# Patient Record
Sex: Female | Born: 1935
Health system: Southern US, Community
[De-identification: ages and names within clinical notes are randomized; demographics above are authoritative.]

## PROBLEM LIST (undated history)

## (undated) DIAGNOSIS — C50919 Malignant neoplasm of unspecified site of unspecified female breast: Secondary | ICD-10-CM

## (undated) DIAGNOSIS — I10 Essential (primary) hypertension: Secondary | ICD-10-CM

## (undated) HISTORY — PX: CHOLECYSTECTOMY: SHX55

## (undated) HISTORY — DX: Essential (primary) hypertension: I10

## (undated) HISTORY — DX: Malignant neoplasm of unspecified site of unspecified female breast: C50.919

## (undated) HISTORY — PX: BREAST SURGERY: SHX581

## (undated) HISTORY — PX: CARPAL TUNNEL RELEASE: SHX101

---

## 2010-10-16 HISTORY — PX: BREAST LUMPECTOMY: SHX2

## 2010-12-26 ENCOUNTER — Other Ambulatory Visit: Payer: Self-pay | Admitting: Family Medicine

## 2010-12-26 DIAGNOSIS — C50912 Malignant neoplasm of unspecified site of left female breast: Secondary | ICD-10-CM

## 2010-12-30 ENCOUNTER — Ambulatory Visit
Admission: RE | Admit: 2010-12-30 | Discharge: 2010-12-30 | Disposition: A | Discharge status: Home / Self Care | Payer: Medicare Other | Source: Ambulatory Visit | Attending: Family Medicine | Admitting: Family Medicine

## 2010-12-30 DIAGNOSIS — C50912 Malignant neoplasm of unspecified site of left female breast: Secondary | ICD-10-CM

## 2010-12-30 MED ORDER — GADOBENATE DIMEGLUMINE 529 MG/ML IV SOLN
20.0000 mL | Freq: Once | INTRAVENOUS | Status: AC | PRN
  Administered 2010-12-30: 20 mL via INTRAVENOUS

## 2011-01-03 ENCOUNTER — Other Ambulatory Visit: Payer: Self-pay | Admitting: Family Medicine

## 2011-01-03 DIAGNOSIS — R928 Other abnormal and inconclusive findings on diagnostic imaging of breast: Secondary | ICD-10-CM

## 2011-01-06 ENCOUNTER — Other Ambulatory Visit: Payer: Self-pay | Admitting: Diagnostic Radiology

## 2011-01-06 ENCOUNTER — Ambulatory Visit
Admission: RE | Admit: 2011-01-06 | Discharge: 2011-01-06 | Disposition: A | Discharge status: Home / Self Care | Payer: Medicare Other | Source: Ambulatory Visit | Attending: Family Medicine | Admitting: Family Medicine

## 2011-01-06 DIAGNOSIS — R928 Other abnormal and inconclusive findings on diagnostic imaging of breast: Secondary | ICD-10-CM

## 2011-01-06 MED ORDER — GADOBENATE DIMEGLUMINE 529 MG/ML IV SOLN
20.0000 mL | Freq: Once | INTRAVENOUS | Status: AC | PRN
  Administered 2011-01-06: 20 mL via INTRAVENOUS

## 2011-01-10 ENCOUNTER — Other Ambulatory Visit (HOSPITAL_COMMUNITY): Payer: Self-pay | Admitting: General Surgery

## 2011-01-10 DIAGNOSIS — C50912 Malignant neoplasm of unspecified site of left female breast: Secondary | ICD-10-CM

## 2011-01-25 ENCOUNTER — Encounter (HOSPITAL_COMMUNITY)
Admission: RE | Admit: 2011-01-25 | Discharge: 2011-01-25 | Disposition: A | Discharge status: Home / Self Care | Payer: Medicare Other | Source: Ambulatory Visit | Attending: General Surgery | Admitting: General Surgery

## 2011-01-25 ENCOUNTER — Other Ambulatory Visit (HOSPITAL_COMMUNITY): Payer: Self-pay | Admitting: General Surgery

## 2011-01-25 ENCOUNTER — Ambulatory Visit (HOSPITAL_COMMUNITY)
Admission: RE | Admit: 2011-01-25 | Discharge: 2011-01-25 | Disposition: A | Discharge status: Home / Self Care | Payer: Medicare Other | Source: Ambulatory Visit | Attending: General Surgery | Admitting: General Surgery

## 2011-01-25 DIAGNOSIS — C50919 Malignant neoplasm of unspecified site of unspecified female breast: Secondary | ICD-10-CM | POA: Insufficient documentation

## 2011-01-25 DIAGNOSIS — Z01811 Encounter for preprocedural respiratory examination: Secondary | ICD-10-CM | POA: Insufficient documentation

## 2011-01-25 DIAGNOSIS — Z01812 Encounter for preprocedural laboratory examination: Secondary | ICD-10-CM | POA: Insufficient documentation

## 2011-01-25 DIAGNOSIS — Z01818 Encounter for other preprocedural examination: Secondary | ICD-10-CM | POA: Insufficient documentation

## 2011-01-25 LAB — SURGICAL PCR SCREEN
MRSA, PCR: NEGATIVE
Staphylococcus aureus: NEGATIVE

## 2011-01-25 LAB — CBC
HCT: 40 % (ref 36.0–46.0)
MCH: 29.5 pg (ref 26.0–34.0)
MCV: 84.9 fL (ref 78.0–100.0)
RDW: 12.1 % (ref 11.5–15.5)
WBC: 7.6 10*3/uL (ref 4.0–10.5)

## 2011-01-25 LAB — COMPREHENSIVE METABOLIC PANEL
Alkaline Phosphatase: 87 U/L (ref 39–117)
BUN: 15 mg/dL (ref 6–23)
Chloride: 94 mEq/L — ABNORMAL LOW (ref 96–112)
Glucose, Bld: 97 mg/dL (ref 70–99)
Potassium: 3.9 mEq/L (ref 3.5–5.1)
Total Bilirubin: 0.8 mg/dL (ref 0.3–1.2)
Total Protein: 7.3 g/dL (ref 6.0–8.3)

## 2011-01-31 ENCOUNTER — Observation Stay (HOSPITAL_COMMUNITY)
Admission: RE | Admit: 2011-01-31 | Discharge: 2011-02-03 | Disposition: A | Discharge status: Home / Self Care | Payer: Medicare Other | Source: Ambulatory Visit | Attending: General Surgery | Admitting: General Surgery

## 2011-01-31 ENCOUNTER — Ambulatory Visit (HOSPITAL_COMMUNITY)
Admission: RE | Admit: 2011-01-31 | Discharge: 2011-01-31 | Disposition: A | Discharge status: Home / Self Care | Payer: Medicare Other | Source: Ambulatory Visit | Attending: General Surgery | Admitting: General Surgery

## 2011-01-31 DIAGNOSIS — I10 Essential (primary) hypertension: Secondary | ICD-10-CM | POA: Insufficient documentation

## 2011-01-31 DIAGNOSIS — E669 Obesity, unspecified: Secondary | ICD-10-CM | POA: Insufficient documentation

## 2011-01-31 DIAGNOSIS — Y9210 Unspecified residential institution as the place of occurrence of the external cause: Secondary | ICD-10-CM | POA: Insufficient documentation

## 2011-01-31 DIAGNOSIS — C50912 Malignant neoplasm of unspecified site of left female breast: Secondary | ICD-10-CM

## 2011-01-31 DIAGNOSIS — C50919 Malignant neoplasm of unspecified site of unspecified female breast: Principal | ICD-10-CM | POA: Insufficient documentation

## 2011-01-31 DIAGNOSIS — IMO0002 Reserved for concepts with insufficient information to code with codable children: Secondary | ICD-10-CM | POA: Insufficient documentation

## 2011-01-31 DIAGNOSIS — Y836 Removal of other organ (partial) (total) as the cause of abnormal reaction of the patient, or of later complication, without mention of misadventure at the time of the procedure: Secondary | ICD-10-CM | POA: Insufficient documentation

## 2011-01-31 DIAGNOSIS — Z79899 Other long term (current) drug therapy: Secondary | ICD-10-CM | POA: Insufficient documentation

## 2011-01-31 DIAGNOSIS — D62 Acute posthemorrhagic anemia: Secondary | ICD-10-CM | POA: Insufficient documentation

## 2011-01-31 MED ORDER — TECHNETIUM TC 99M SULFUR COLLOID FILTERED
1.0000 | Freq: Once | INTRAVENOUS | Status: AC | PRN
  Administered 2011-01-31: 1 via INTRADERMAL

## 2011-02-01 ENCOUNTER — Other Ambulatory Visit: Payer: Self-pay | Admitting: General Surgery

## 2011-02-01 LAB — CBC
MCH: 29.5 pg (ref 26.0–34.0)
MCV: 83.4 fL (ref 78.0–100.0)
Platelets: 226 10*3/uL (ref 150–400)
Platelets: 221 10*3/uL (ref 150–400)
RDW: 12.1 % (ref 11.5–15.5)
RDW: 12.3 % (ref 11.5–15.5)
WBC: 13.2 10*3/uL — ABNORMAL HIGH (ref 4.0–10.5)

## 2011-02-01 LAB — ABO/RH: ABO/RH(D): O NEG

## 2011-02-02 LAB — PREPARE RBC (CROSSMATCH)

## 2011-02-02 LAB — CBC
HCT: 22.4 % — ABNORMAL LOW (ref 36.0–46.0)
Hemoglobin: 7.9 g/dL — ABNORMAL LOW (ref 12.0–15.0)
RDW: 13 % (ref 11.5–15.5)
WBC: 10.8 10*3/uL — ABNORMAL HIGH (ref 4.0–10.5)

## 2011-02-02 LAB — BASIC METABOLIC PANEL
BUN: 22 mg/dL (ref 6–23)
GFR calc non Af Amer: 37 mL/min — ABNORMAL LOW (ref >60)
Potassium: 4.3 mEq/L (ref 3.5–5.1)
Sodium: 126 mEq/L — ABNORMAL LOW (ref 135–145)

## 2011-02-03 LAB — BASIC METABOLIC PANEL
BUN: 11 mg/dL (ref 6–23)
Chloride: 100 mEq/L (ref 96–112)
GFR calc Af Amer: >60 mL/min (ref >60)
Potassium: 4.2 mEq/L (ref 3.5–5.1)
Sodium: 132 mEq/L — ABNORMAL LOW (ref 135–145)

## 2011-02-03 LAB — CBC
MCV: 85.6 fL (ref 78.0–100.0)
Platelets: 178 10*3/uL (ref 150–400)
RBC: 2.98 MIL/uL — ABNORMAL LOW (ref 3.87–5.11)
WBC: 7.9 10*3/uL (ref 4.0–10.5)

## 2011-02-03 LAB — TYPE AND SCREEN

## 2011-02-03 NOTE — Op Note (Signed)
Rebecca Gonzalez, Rebecca Gonzalez               ACCOUNT NO.:  [id number]  MEDICAL RECORD NO.:  [id number]           PATIENT TYPE:  O  LOCATION:  5124                         FACILITY:  [facility]  PHYSICIAN:  Wilmon Arms. Corliss Skains, M.D. DATE OF BIRTH:  1935-07-12  DATE OF PROCEDURE:  02/01/2011 DATE OF DISCHARGE:                              OPERATIVE REPORT   PREOPERATIVE DIAGNOSIS:  Post mastectomy hematoma.  POSTOPERATIVE DIAGNOSIS:  Post mastectomy hematoma.  PROCEDURE PERFORMED:  Irrigation and drainage of a post mastectomy hematoma.  SURGEON:  Wilmon Arms. Ruben Mahler, MD  ANESTHESIA:  General.  INDICATIONS:  This is a 76 year old female who underwent a left mastectomy and left axillary sentinel lymph node biopsy on January 31, 2011.  She had 2 drains placed.  Postoperatively, the patient initially was doing well but acutely developed a lot of swelling under her upper skin flap and as well as a bleeding through her drain and around her drain sites.  I was the surgeon on-call and I was called to see the patient.  We immediately made a decision to return to the operating room.  Her preoperative hemoglobin was 13.9.  Her hemoglobin prior to returning to the operating room was 10.6.  DESCRIPTION OF PROCEDURE:  The patient was brought to the operating room and placed in supine position on the operating room table.  After an adequate level of general anesthesia was obtained, her left chest was prepped with ChloraPrep and draped in sterile fashion.  We opened her old incision which had been closed with 4-0 Monocryl as well as 3-0 Vicryl.  We encountered a large amount of clot.  We evacuated about 500 mL of clot.  There was some liquid blood in the axillary region.  There were no large pumping vessels that were visualized.  We then systematically began examining all of the operative site.  We began medially under the superior flap.  We irrigated and slowly inspected for hemostasis.  There were several tiny  areas that were minimally oozing that we cauterized.  We continued working laterally under the upper flap.  Once we were satisfied with the hemostasis in this area, we worked medial to lateral and under the lower flap.  Most of the oozing that we noted was from the posterior surface of the flap.  The muscle of the pectoralis actually seemed to be completely dry with no sign of bleeding.  We then spent the remainder of our operative time in the axilla.  No large bleeding vessels were identified.  However, there were several areas that were oozing which were thoroughly cauterized.  Once we were satisfied with our hemostasis, we thoroughly irrigated the entire surgical site with warm saline.  Tisseel was applied in the axilla and under the upper flap.  Two pieces of Surgicel SNoW were placed into the pockets in the axilla where the sentinel lymph node had been biopsied.  We then closed with multiple interrupted 3-0 Vicryl sutures.  4-0 Monocryl was used to close the skin in a subcuticular fashion.  Dermabond was applied as well as Steri-Strips.  The patient was then placed in a new breast  binder.  Please note that prior to closing, we replaced her drains.  The medial drain goes underneath the inferior flap medially around the upper flap.  The more lateral drain is in her axilla.  These drains were sutured in with 2-0 Ethilon.  Dry dressing was applied.  The patient was extubated and brought to recovery room in stable condition.  All sponge, instrument, and needle counts were correct.     Wilmon Arms. Corliss Skains, M.D.     MKT/MEDQ  D:  02/01/2011  T:  02/01/2011  Job:  [id number]  Electronically Signed by Manus Rudd M.D. on 02/03/2011 10:30:53 AM

## 2011-02-03 NOTE — Op Note (Signed)
NAMEREBECKA, OELKERS NO.:  [id number]  MEDICAL RECORD NO.:  [id number]           PATIENT TYPE:  O  LOCATION:  5124                         FACILITY:  [facility]  PHYSICIAN:  Juanetta Gosling, MDDATE OF BIRTH:  1935-06-02  DATE OF PROCEDURE:  01/31/2011 DATE OF DISCHARGE:                              OPERATIVE REPORT   PREOPERATIVE DIAGNOSIS:  Multicentric left breast cancer.  POSTOPERATIVE DIAGNOSIS:  Multicentric left breast cancer.  PROCEDURE: 1. Left simple mastectomy. 2. Injection of blue dye for sentinel node identification. 3. Left axillary sentinel node biopsy.  SURGEON:  Troy Sine. Dwain Sarna, MD  ASSISTANT:  None.  ANESTHESIA:  General.  SPECIMEN: 1. Left breast marked short stitch superior, long stitch lateral. 2. Left axillary sentinel node biopsy with counts of 852.  DISPOSITION OF SPECIMENS:  Pathology.  DRAINS:  Two 19-French Blake drains to mastectomy space in axilla.  ESTIMATED BLOOD LOSS:  100 mL.  COMPLICATIONS:  None.  DISPOSITION:  To recovery room in stable condition.  INDICATIONS:  Ms. Gainey is a 76 year old female who underwent a routine screening mammogram with a possible left breast mass, underwent an ultrasound.  There was a small mass that underwent core biopsy showing invasive ductal carcinoma that was grade 1.  This was ER positive and weakly PR positive.  HER-2 was negative.  She underwent MRI showed another mass in the outer quadrant of the breast that was biopsy showing DCIS.  These areas were about 7 cm apart in two separate quadrants of the breast.  She and I discussed her options and we elected to perform a left simple mastectomy with a sentinel node biopsy at the same time.  PROCEDURE:  After informed consent was obtained, the patient was taken to the operating room after being administered technetium in all four cardinal positions around the breast.  She then had sequential compression devices placed.   She was administered 400 mg of IV ciprofloxacin due to her penicillin allergy.  She was then placed under general anesthesia with an LMA without complication.  Surgical time-out was then performed.  I cleansed the around the nipple and I injected a milliliter of saline and methylene blue mixture in all four positions around her areola. This was massaged for 3 minutes.  Her left breast and axilla were then prepped and draped in standard sterile surgical fashion.  I then made a large elliptical incision encompassing her nipple and areola and developed flaps in the breast, to the clavicle, sternum, and below the inframammary crease and onto the latissimus laterally.  There were a couple of places that bled and were from the pectoral vessels that I placed 3-0 Vicryl sutures as well as several clips.  Once I had reached all the margins, I then removed the breast from the pectoralis muscle including the pectoralis fascia without difficulty.  I then placed a couple of Kelly's at the lateralmost portion of this and tied these areas where the vessels near the latissimus were.  This was then passed off table as specimen.  I inspected this to see if there were any sentinel nodes in there which they  were not.  I then placed a probe in the axilla.  I identified one sentinel node.  This was excised with the counts above, the background counts were less than 10.  There were no other blue dye identified at all either.  These were all passed off the table as a specimen.  Irrigation was performed and hemostasis was observed.  I placed two 19-French Blake drains.  I then closed the dermis with multiple 3-0 interrupted Vicryl sutures.  The skin was closed with 4-0 Monocryl.  I then placed Dermabond over the wound. Steri-Strips were placed over this.  The drains were both functional upon completion.  Dressing was placed and the binder was then placed over this.  She tolerated this well, was extubated in the  operating room, and transferred to the recovery room in stable condition.     Juanetta Gosling, MD     MCW/MEDQ  D:  01/31/2011  T:  02/01/2011  Job:  [id number]  cc:   Ivan Anchors, M.D.  Electronically Signed by Emelia Loron MD on 02/03/2011 08:58:10 AM

## 2011-02-07 ENCOUNTER — Ambulatory Visit
Admission: RE | Admit: 2011-02-07 | Discharge: 2011-02-07 | Disposition: A | Discharge status: Home / Self Care | Payer: Medicare Other | Source: Ambulatory Visit | Attending: General Surgery | Admitting: General Surgery

## 2011-02-07 ENCOUNTER — Other Ambulatory Visit: Payer: Self-pay | Admitting: General Surgery

## 2011-02-07 DIAGNOSIS — M7989 Other specified soft tissue disorders: Secondary | ICD-10-CM

## 2011-02-20 ENCOUNTER — Encounter (INDEPENDENT_AMBULATORY_CARE_PROVIDER_SITE_OTHER): Payer: Self-pay | Admitting: General Surgery

## 2011-04-21 ENCOUNTER — Ambulatory Visit (INDEPENDENT_AMBULATORY_CARE_PROVIDER_SITE_OTHER): Payer: Medicare Other | Admitting: General Surgery

## 2011-04-21 ENCOUNTER — Encounter (INDEPENDENT_AMBULATORY_CARE_PROVIDER_SITE_OTHER): Payer: Self-pay | Admitting: General Surgery

## 2011-04-21 DIAGNOSIS — N63 Unspecified lump in unspecified breast: Secondary | ICD-10-CM | POA: Insufficient documentation

## 2011-04-21 DIAGNOSIS — C50919 Malignant neoplasm of unspecified site of unspecified female breast: Secondary | ICD-10-CM

## 2011-04-21 NOTE — Progress Notes (Signed)
Subjective:     Patient ID: Rebecca Gonzalez, female   DOB: Feb 27, 1935, 76 y.o.   MRN: [id number]    There were no vitals taken for this visit.    HPIThis is a 76 year old female underwent left simple mastectomy stage I breast cancer. She had a seroma that was drained afterwards. She has been seen by Dr. Margo Common who is following her coronary. And she is comfortable not to be on any adjuvant therapy right now. She came in today just for a final check after I drained or seroma the last time I saw her. She's really do well with any complaints and was to be released to full activity at this point.She has been undergoing massage therapy at [facility] which is helped a lot of her arm function as well as her healing.   Review of Systems     Objective:   Physical Examwell healed left mastectomy incision, no infection, no seroma     Assessment:     Stage I left breast cancer s/p mastectomy    Plan:        I released to full activity today. She is due to get her mammogram in October. She is due to see Dr. Cleone Slim in August. I asked her to come back and see me 6 months after she saw Dr. Cleone Slim had to switch visits with him.

## 2011-05-29 ENCOUNTER — Encounter (INDEPENDENT_AMBULATORY_CARE_PROVIDER_SITE_OTHER): Payer: Self-pay | Admitting: General Surgery

## 2011-05-29 ENCOUNTER — Ambulatory Visit (INDEPENDENT_AMBULATORY_CARE_PROVIDER_SITE_OTHER): Payer: Medicare Other | Admitting: General Surgery

## 2011-05-29 VITALS — BP 170/88 | HR 84 | Temp 97.1°F | Wt 229.6 lb

## 2011-05-29 DIAGNOSIS — R233 Spontaneous ecchymoses: Secondary | ICD-10-CM

## 2011-05-29 NOTE — Progress Notes (Signed)
Subjective:     Patient ID: Rebecca Gonzalez, female   DOB: [birth year], 76 y.o.   MRN: [id number]  HPI This patient follows up after a left mastectomy performed in April of this year. She has been doing well since her procedure until approximately 2 weeks ago she was doing some stretching exercises and felt a "pop" in her left chest and breast area under her previous mastectomy scar. She had a little bit of "soreness" but a few days later developed some redness and bruising in the region. She denies any fevers or chills or any pain. She is not on any blood there is currently. She is currently being treated at the lymphedema clinic for some lymphedema in the chest region but does not have much swelling in the arm.  Review of Systems     Objective:   Physical Exam She is in no acute distress and nontoxic-appearing sitting comfortably in the bed. Her left chest demonstrates a well-healed mastectomy scar with some lymphedema in the skin of the chest. She has some ecchymosis in the medial aspect of her incision with evidence of bruising in the later stages of healing extending lateral. There is a greenish hue to the skin in the area of the bruising. There is no evidence of blanching erythema, crepitus, tenderness. There is no evidence of suspicious masses.    Assessment:     Chest wall bruising    Plan:     This is certainly an interesting finding.  She appears to have some bruising on the medial aspect of her mastectomy scar without any evidence of recurrent disease. This appears to be in the healing stages and I suspect will resolve spontaneously. I am not sure why she would have had spontaneous bleeding under her incision other than due to her stretching and exercise. Again, I think that this will resolve spontaneously and I do not see any evidence of suspicious skin lesions or recurrent disease. I think it would be okay if she continue with her lymphatic treatments. I recommended that she follow up  in 2 weeks if her symptoms do not completely resolve.

## 2011-05-29 NOTE — Patient Instructions (Signed)
Follow up in 2 weeks if redness and bruising is not completely resolved

## 2011-05-30 ENCOUNTER — Encounter (HOSPITAL_COMMUNITY)
Admission: RE | Disposition: A | Discharge status: Home / Self Care | Payer: Self-pay | Source: Ambulatory Visit | Attending: Ophthalmology

## 2011-05-30 ENCOUNTER — Ambulatory Visit (HOSPITAL_COMMUNITY)
Admission: RE | Admit: 2011-05-30 | Discharge: 2011-05-30 | Disposition: A | Discharge status: Home / Self Care | Payer: Medicare Other | Source: Ambulatory Visit | Attending: Ophthalmology | Admitting: Ophthalmology

## 2011-05-30 ENCOUNTER — Encounter (HOSPITAL_COMMUNITY): Payer: Self-pay | Admitting: *Deleted

## 2011-05-30 DIAGNOSIS — Z961 Presence of intraocular lens: Secondary | ICD-10-CM | POA: Insufficient documentation

## 2011-05-30 DIAGNOSIS — I10 Essential (primary) hypertension: Secondary | ICD-10-CM | POA: Insufficient documentation

## 2011-05-30 DIAGNOSIS — H26499 Other secondary cataract, unspecified eye: Secondary | ICD-10-CM | POA: Insufficient documentation

## 2011-05-30 SURGERY — TREATMENT, USING YAG LASER
Anesthesia: LOCAL | Laterality: Left

## 2011-05-30 MED ORDER — CYCLOPENTOLATE-PHENYLEPHRINE 0.2-1 % OP SOLN
OPHTHALMIC | Status: AC
  Administered 2011-05-30: 1 [drp] via OPHTHALMIC
  Filled 2011-05-30: qty 2

## 2011-05-30 MED ORDER — CYCLOPENTOLATE-PHENYLEPHRINE 0.2-1 % OP SOLN
1.0000 [drp] | OPHTHALMIC | Status: AC
  Administered 2011-05-30 (×3): 1 [drp] via OPHTHALMIC

## 2011-05-30 NOTE — Progress Notes (Signed)
Rebecca Gonzalez  05/30/2011     Instructions    Activity: No Restrictions.   Diet: Resume Diet you were on at home.   Pain Medication: Tylenol if Needed.   CONTACT YOUR DOCTOR IF YOU HAVE PAIN, REDNESS IN YOUR EYE, OR DECREASED VISION.   Follow-up:today @ 1300 with Najee Cowens T. Nile Riggs.   Dr. Nile Riggs: [phone]  Dr. Lita Mains: [phone]  Dr. Alto Denver: [phone]   If you find that you cannot contact your physician, but feel that your signs and   Symptoms warrant a physician's attention, call the Emergency Room at   [phone] ext.[phone]. Marland Kitchen

## 2011-05-30 NOTE — Brief Op Note (Signed)
               Rebecca Gonzalez 05/30/2011  Jahki Witham T. Nile Riggs  Yag Laser Self Test Completedyes. Procedure: Posterior Capsulotomy, left eye.  Eye Protection Worn by Staff yes. Laser In Use Sign on Door yes.  Laser: Nd:YAG Spot Size: Fixed Burst Mode: 1 Power Setting:  3.20mJ/burst  Number of shots: 29 Total energy delivered: 98.9 mJ  Patency of the peripheral iridotomy was confirmed visually.  The patient tolerated the procedure without difficulty. No complications were encountered.    The patient was discharged home with the instructions to continue all her current glaucoma medications, if any.   Patient instructed to go to office at 0100 for intraocular pressure check.  Patient verbalizes understanding of discharge instructions

## 2011-06-12 ENCOUNTER — Ambulatory Visit (INDEPENDENT_AMBULATORY_CARE_PROVIDER_SITE_OTHER): Payer: Medicare Other | Admitting: General Surgery

## 2011-06-12 ENCOUNTER — Encounter (INDEPENDENT_AMBULATORY_CARE_PROVIDER_SITE_OTHER): Payer: Self-pay | Admitting: General Surgery

## 2011-06-12 DIAGNOSIS — Z853 Personal history of malignant neoplasm of breast: Secondary | ICD-10-CM

## 2011-06-12 NOTE — Progress Notes (Signed)
Subjective:     Patient ID: Rebecca Gonzalez, female   DOB: 09-Nov-1934, 76 y.o.   MRN: [id number]  HPI This is a 76 year old female who is status post left simple mastectomy as well as a sentinel node biopsy for invasive ductal carcinoma. This was complicated by hematoma that needed to be evacuated. She has been doing well. She is also followed by Dr. Cleone Slim. She a couple weeks ago had an episode of stretching where she felt a pop in the medial aspect of her mastectomy incision followed by some ecchymoses. She was seen in our practice at that point. This has gotten better but she still is mildly tenderness still is an area of ecchymosis present the medial aspect of her incision. She comes in today just had this area checked.  Review of Systems     Objective:   Physical Exam Well healed left mastectomy incision with about a 6x6 area of firmness consistent with ecchymosis, this is better per her report than a couple weeks ago, there is no evidence of recurrence    Assessment:     Hematoma s/p mastectomy distant past    Plan:     I don't think this is a recurrence of her cancer. Clinically it appears very much like a hematoma that occurred after injury during exercise. I told her that I think we can just let this area resolved over some time. Her regularly scheduled followup was with me in November when I will see her again. At that point this area still there we may consider ultrasound as well but I think he should just resolve over some time.

## 2011-10-24 DIAGNOSIS — S20219A Contusion of unspecified front wall of thorax, initial encounter: Secondary | ICD-10-CM | POA: Diagnosis not present

## 2011-12-05 DIAGNOSIS — K219 Gastro-esophageal reflux disease without esophagitis: Secondary | ICD-10-CM | POA: Diagnosis not present

## 2011-12-05 DIAGNOSIS — I10 Essential (primary) hypertension: Secondary | ICD-10-CM | POA: Diagnosis not present

## 2011-12-05 DIAGNOSIS — Z79899 Other long term (current) drug therapy: Secondary | ICD-10-CM | POA: Diagnosis not present

## 2011-12-05 DIAGNOSIS — Z09 Encounter for follow-up examination after completed treatment for conditions other than malignant neoplasm: Secondary | ICD-10-CM | POA: Diagnosis not present

## 2011-12-05 DIAGNOSIS — Z9010 Acquired absence of unspecified breast and nipple: Secondary | ICD-10-CM | POA: Diagnosis not present

## 2011-12-05 DIAGNOSIS — Z853 Personal history of malignant neoplasm of breast: Secondary | ICD-10-CM | POA: Diagnosis not present

## 2011-12-05 DIAGNOSIS — Z1231 Encounter for screening mammogram for malignant neoplasm of breast: Secondary | ICD-10-CM | POA: Diagnosis not present

## 2011-12-12 DIAGNOSIS — I10 Essential (primary) hypertension: Secondary | ICD-10-CM | POA: Diagnosis not present

## 2011-12-12 DIAGNOSIS — Z853 Personal history of malignant neoplasm of breast: Secondary | ICD-10-CM | POA: Diagnosis not present

## 2011-12-12 DIAGNOSIS — Z79899 Other long term (current) drug therapy: Secondary | ICD-10-CM | POA: Diagnosis not present

## 2011-12-12 DIAGNOSIS — Z9010 Acquired absence of unspecified breast and nipple: Secondary | ICD-10-CM | POA: Diagnosis not present

## 2011-12-12 DIAGNOSIS — K219 Gastro-esophageal reflux disease without esophagitis: Secondary | ICD-10-CM | POA: Diagnosis not present

## 2011-12-12 DIAGNOSIS — Z09 Encounter for follow-up examination after completed treatment for conditions other than malignant neoplasm: Secondary | ICD-10-CM | POA: Diagnosis not present

## 2011-12-12 DIAGNOSIS — Z1231 Encounter for screening mammogram for malignant neoplasm of breast: Secondary | ICD-10-CM | POA: Diagnosis not present

## 2011-12-22 ENCOUNTER — Encounter (INDEPENDENT_AMBULATORY_CARE_PROVIDER_SITE_OTHER): Payer: Self-pay | Admitting: Hematology and Oncology

## 2011-12-22 ENCOUNTER — Encounter (INDEPENDENT_AMBULATORY_CARE_PROVIDER_SITE_OTHER): Payer: Self-pay

## 2011-12-22 ENCOUNTER — Encounter (INDEPENDENT_AMBULATORY_CARE_PROVIDER_SITE_OTHER): Payer: Self-pay | Admitting: General Surgery

## 2011-12-22 ENCOUNTER — Ambulatory Visit (INDEPENDENT_AMBULATORY_CARE_PROVIDER_SITE_OTHER): Payer: Medicare Other | Admitting: General Surgery

## 2011-12-22 VITALS — BP 152/94 | HR 80 | Temp 96.9°F | Resp 16 | Ht 62.0 in | Wt 236.4 lb

## 2011-12-22 DIAGNOSIS — Z853 Personal history of malignant neoplasm of breast: Secondary | ICD-10-CM | POA: Diagnosis not present

## 2011-12-22 DIAGNOSIS — C50919 Malignant neoplasm of unspecified site of unspecified female breast: Secondary | ICD-10-CM | POA: Insufficient documentation

## 2011-12-22 NOTE — Progress Notes (Signed)
Subjective:     Patient ID: Rebecca Gonzalez, female   DOB: Dec 13, 1934, 77 y.o.   MRN: [id number]  HPI This is a 77 year old female underwent a left simple mastectomy for a stage I left breast cancer. Since then she's had a hematoma after she was exercising which is now resolved. She had another episode where she has some bruising after some strenuous exercise but is otherwise doing very well at this point. She comes in today for an annual visit without any significant complaints. She states that she recently had her right-sided mammogram at [facility] where this was recommended to have a year followup. She has no specific complaints were filled to her right breast or her mastectomy site today.  Review of Systems     Objective:   Physical Exam  Vitals reviewed. Constitutional: She appears well-developed and well-nourished.  Pulmonary/Chest: Right breast exhibits no inverted nipple, no mass, no nipple discharge, no skin change and no tenderness. Left breast exhibits no mass.    Lymphadenopathy:    She has no cervical adenopathy.    She has no axillary adenopathy.       Right: No supraclavicular adenopathy present.       Left: No supraclavicular adenopathy present.       Assessment:     History stage I left breast cancer    Plan:     She has no clinical evidence of any recurrent disease. She is going to continue her own self exams as well as her annual mammography. I will review her latest mammogram. She is due to see medical oncology in 6 months. I offered that I can see her annually in between her medical oncology visits. I told her that I think he issues with her hematoma likely from her muscle at the medial aspect of her incision and that she can continue doing whatever sort of exercise that she likes.

## 2011-12-22 NOTE — Patient Instructions (Signed)

## 2012-02-28 DIAGNOSIS — E782 Mixed hyperlipidemia: Secondary | ICD-10-CM | POA: Diagnosis not present

## 2012-03-06 DIAGNOSIS — J309 Allergic rhinitis, unspecified: Secondary | ICD-10-CM | POA: Diagnosis not present

## 2012-03-06 DIAGNOSIS — E669 Obesity, unspecified: Secondary | ICD-10-CM | POA: Diagnosis not present

## 2012-03-06 DIAGNOSIS — M1990 Unspecified osteoarthritis, unspecified site: Secondary | ICD-10-CM | POA: Diagnosis not present

## 2012-03-06 DIAGNOSIS — E782 Mixed hyperlipidemia: Secondary | ICD-10-CM | POA: Diagnosis not present

## 2012-03-06 DIAGNOSIS — C50919 Malignant neoplasm of unspecified site of unspecified female breast: Secondary | ICD-10-CM | POA: Diagnosis not present

## 2012-03-06 DIAGNOSIS — I10 Essential (primary) hypertension: Secondary | ICD-10-CM | POA: Diagnosis not present

## 2012-06-10 ENCOUNTER — Encounter: Payer: Medicare Other | Admitting: Hematology and Oncology

## 2012-06-10 DIAGNOSIS — C50919 Malignant neoplasm of unspecified site of unspecified female breast: Secondary | ICD-10-CM

## 2012-06-10 DIAGNOSIS — K219 Gastro-esophageal reflux disease without esophagitis: Secondary | ICD-10-CM | POA: Diagnosis not present

## 2012-06-10 DIAGNOSIS — I10 Essential (primary) hypertension: Secondary | ICD-10-CM | POA: Diagnosis not present

## 2012-07-30 DIAGNOSIS — H2510 Age-related nuclear cataract, unspecified eye: Secondary | ICD-10-CM | POA: Diagnosis not present

## 2012-07-30 DIAGNOSIS — H524 Presbyopia: Secondary | ICD-10-CM | POA: Diagnosis not present

## 2012-07-30 DIAGNOSIS — H26019 Infantile and juvenile cortical, lamellar, or zonular cataract, unspecified eye: Secondary | ICD-10-CM | POA: Diagnosis not present

## 2012-07-30 DIAGNOSIS — Z961 Presence of intraocular lens: Secondary | ICD-10-CM | POA: Diagnosis not present

## 2012-08-23 DIAGNOSIS — R5383 Other fatigue: Secondary | ICD-10-CM | POA: Diagnosis not present

## 2012-08-23 DIAGNOSIS — R05 Cough: Secondary | ICD-10-CM | POA: Diagnosis not present

## 2012-08-23 DIAGNOSIS — R072 Precordial pain: Secondary | ICD-10-CM | POA: Diagnosis not present

## 2012-08-23 DIAGNOSIS — E039 Hypothyroidism, unspecified: Secondary | ICD-10-CM | POA: Diagnosis not present

## 2012-08-23 DIAGNOSIS — R5381 Other malaise: Secondary | ICD-10-CM | POA: Diagnosis not present

## 2012-08-23 DIAGNOSIS — R0602 Shortness of breath: Secondary | ICD-10-CM | POA: Diagnosis not present

## 2012-08-23 DIAGNOSIS — I10 Essential (primary) hypertension: Secondary | ICD-10-CM | POA: Diagnosis not present

## 2012-08-27 DIAGNOSIS — R0602 Shortness of breath: Secondary | ICD-10-CM | POA: Diagnosis not present

## 2012-08-27 DIAGNOSIS — I10 Essential (primary) hypertension: Secondary | ICD-10-CM | POA: Diagnosis not present

## 2012-08-27 DIAGNOSIS — R079 Chest pain, unspecified: Secondary | ICD-10-CM | POA: Diagnosis not present

## 2012-09-10 DIAGNOSIS — C50919 Malignant neoplasm of unspecified site of unspecified female breast: Secondary | ICD-10-CM | POA: Diagnosis not present

## 2012-09-10 DIAGNOSIS — J309 Allergic rhinitis, unspecified: Secondary | ICD-10-CM | POA: Diagnosis not present

## 2012-09-10 DIAGNOSIS — E669 Obesity, unspecified: Secondary | ICD-10-CM | POA: Diagnosis not present

## 2012-09-10 DIAGNOSIS — E782 Mixed hyperlipidemia: Secondary | ICD-10-CM | POA: Diagnosis not present

## 2012-09-10 DIAGNOSIS — M1990 Unspecified osteoarthritis, unspecified site: Secondary | ICD-10-CM | POA: Diagnosis not present

## 2012-09-10 DIAGNOSIS — I10 Essential (primary) hypertension: Secondary | ICD-10-CM | POA: Diagnosis not present

## 2012-10-11 DIAGNOSIS — L820 Inflamed seborrheic keratosis: Secondary | ICD-10-CM | POA: Diagnosis not present

## 2012-10-11 DIAGNOSIS — L821 Other seborrheic keratosis: Secondary | ICD-10-CM | POA: Diagnosis not present

## 2012-12-09 DIAGNOSIS — Z1231 Encounter for screening mammogram for malignant neoplasm of breast: Secondary | ICD-10-CM | POA: Diagnosis not present

## 2013-01-17 ENCOUNTER — Telehealth (INDEPENDENT_AMBULATORY_CARE_PROVIDER_SITE_OTHER): Payer: Self-pay | Admitting: General Surgery

## 2013-01-17 NOTE — Telephone Encounter (Signed)
Misty Stanley called from [employer] and wanted to know if we can let the patient have a new prothesis bra, the old one she has is to little, I gave a orders over the phone for a new bra

## 2013-02-26 DIAGNOSIS — E782 Mixed hyperlipidemia: Secondary | ICD-10-CM | POA: Diagnosis not present

## 2013-02-26 DIAGNOSIS — C50919 Malignant neoplasm of unspecified site of unspecified female breast: Secondary | ICD-10-CM | POA: Diagnosis not present

## 2013-02-26 DIAGNOSIS — I10 Essential (primary) hypertension: Secondary | ICD-10-CM | POA: Diagnosis not present

## 2013-02-26 DIAGNOSIS — E669 Obesity, unspecified: Secondary | ICD-10-CM | POA: Diagnosis not present

## 2013-02-26 DIAGNOSIS — J309 Allergic rhinitis, unspecified: Secondary | ICD-10-CM | POA: Diagnosis not present

## 2013-02-26 DIAGNOSIS — M1990 Unspecified osteoarthritis, unspecified site: Secondary | ICD-10-CM | POA: Diagnosis not present

## 2013-03-05 DIAGNOSIS — E782 Mixed hyperlipidemia: Secondary | ICD-10-CM | POA: Diagnosis not present

## 2013-03-05 DIAGNOSIS — C50919 Malignant neoplasm of unspecified site of unspecified female breast: Secondary | ICD-10-CM | POA: Diagnosis not present

## 2013-03-05 DIAGNOSIS — Z0000 Encounter for general adult medical examination without abnormal findings: Secondary | ICD-10-CM | POA: Diagnosis not present

## 2013-03-05 DIAGNOSIS — E669 Obesity, unspecified: Secondary | ICD-10-CM | POA: Diagnosis not present

## 2013-03-05 DIAGNOSIS — I10 Essential (primary) hypertension: Secondary | ICD-10-CM | POA: Diagnosis not present

## 2013-03-05 DIAGNOSIS — M1990 Unspecified osteoarthritis, unspecified site: Secondary | ICD-10-CM | POA: Diagnosis not present

## 2013-03-05 DIAGNOSIS — J309 Allergic rhinitis, unspecified: Secondary | ICD-10-CM | POA: Diagnosis not present

## 2013-08-13 DIAGNOSIS — E782 Mixed hyperlipidemia: Secondary | ICD-10-CM | POA: Diagnosis not present

## 2013-08-13 DIAGNOSIS — I10 Essential (primary) hypertension: Secondary | ICD-10-CM | POA: Diagnosis not present

## 2013-08-13 DIAGNOSIS — E669 Obesity, unspecified: Secondary | ICD-10-CM | POA: Diagnosis not present

## 2013-08-20 DIAGNOSIS — Z1331 Encounter for screening for depression: Secondary | ICD-10-CM | POA: Diagnosis not present

## 2013-08-20 DIAGNOSIS — M1990 Unspecified osteoarthritis, unspecified site: Secondary | ICD-10-CM | POA: Diagnosis not present

## 2013-08-20 DIAGNOSIS — C50919 Malignant neoplasm of unspecified site of unspecified female breast: Secondary | ICD-10-CM | POA: Diagnosis not present

## 2013-08-20 DIAGNOSIS — I10 Essential (primary) hypertension: Secondary | ICD-10-CM | POA: Diagnosis not present

## 2013-08-20 DIAGNOSIS — E782 Mixed hyperlipidemia: Secondary | ICD-10-CM | POA: Diagnosis not present

## 2013-08-20 DIAGNOSIS — E669 Obesity, unspecified: Secondary | ICD-10-CM | POA: Diagnosis not present

## 2013-08-20 DIAGNOSIS — J309 Allergic rhinitis, unspecified: Secondary | ICD-10-CM | POA: Diagnosis not present

## 2013-10-14 DIAGNOSIS — H43399 Other vitreous opacities, unspecified eye: Secondary | ICD-10-CM | POA: Diagnosis not present

## 2013-10-14 DIAGNOSIS — H524 Presbyopia: Secondary | ICD-10-CM | POA: Diagnosis not present

## 2013-10-14 DIAGNOSIS — H2510 Age-related nuclear cataract, unspecified eye: Secondary | ICD-10-CM | POA: Diagnosis not present

## 2013-10-14 DIAGNOSIS — H31009 Unspecified chorioretinal scars, unspecified eye: Secondary | ICD-10-CM | POA: Diagnosis not present

## 2013-10-14 DIAGNOSIS — Z961 Presence of intraocular lens: Secondary | ICD-10-CM | POA: Diagnosis not present

## 2013-12-10 DIAGNOSIS — Z1231 Encounter for screening mammogram for malignant neoplasm of breast: Secondary | ICD-10-CM | POA: Diagnosis not present

## 2014-03-16 DIAGNOSIS — R5381 Other malaise: Secondary | ICD-10-CM | POA: Diagnosis not present

## 2014-03-16 DIAGNOSIS — E782 Mixed hyperlipidemia: Secondary | ICD-10-CM | POA: Diagnosis not present

## 2014-03-16 DIAGNOSIS — K2100 Gastro-esophageal reflux disease with esophagitis, without bleeding: Secondary | ICD-10-CM | POA: Diagnosis not present

## 2014-03-16 DIAGNOSIS — R7309 Other abnormal glucose: Secondary | ICD-10-CM | POA: Diagnosis not present

## 2014-03-16 DIAGNOSIS — I10 Essential (primary) hypertension: Secondary | ICD-10-CM | POA: Diagnosis not present

## 2014-03-16 DIAGNOSIS — R5383 Other fatigue: Secondary | ICD-10-CM | POA: Diagnosis not present

## 2014-03-20 DIAGNOSIS — C50919 Malignant neoplasm of unspecified site of unspecified female breast: Secondary | ICD-10-CM | POA: Diagnosis not present

## 2014-03-20 DIAGNOSIS — M1990 Unspecified osteoarthritis, unspecified site: Secondary | ICD-10-CM | POA: Diagnosis not present

## 2014-03-20 DIAGNOSIS — E669 Obesity, unspecified: Secondary | ICD-10-CM | POA: Diagnosis not present

## 2014-03-20 DIAGNOSIS — I10 Essential (primary) hypertension: Secondary | ICD-10-CM | POA: Diagnosis not present

## 2014-03-20 DIAGNOSIS — J309 Allergic rhinitis, unspecified: Secondary | ICD-10-CM | POA: Diagnosis not present

## 2014-03-20 DIAGNOSIS — Z0000 Encounter for general adult medical examination without abnormal findings: Secondary | ICD-10-CM | POA: Diagnosis not present

## 2014-03-20 DIAGNOSIS — K2100 Gastro-esophageal reflux disease with esophagitis, without bleeding: Secondary | ICD-10-CM | POA: Diagnosis not present

## 2014-03-20 DIAGNOSIS — E782 Mixed hyperlipidemia: Secondary | ICD-10-CM | POA: Diagnosis not present

## 2014-08-26 DIAGNOSIS — I10 Essential (primary) hypertension: Secondary | ICD-10-CM | POA: Diagnosis not present

## 2014-08-26 DIAGNOSIS — E6609 Other obesity due to excess calories: Secondary | ICD-10-CM | POA: Diagnosis not present

## 2014-08-26 DIAGNOSIS — E782 Mixed hyperlipidemia: Secondary | ICD-10-CM | POA: Diagnosis not present

## 2014-09-17 DIAGNOSIS — K210 Gastro-esophageal reflux disease with esophagitis: Secondary | ICD-10-CM | POA: Diagnosis not present

## 2014-09-17 DIAGNOSIS — R739 Hyperglycemia, unspecified: Secondary | ICD-10-CM | POA: Diagnosis not present

## 2014-09-17 DIAGNOSIS — I10 Essential (primary) hypertension: Secondary | ICD-10-CM | POA: Diagnosis not present

## 2014-09-24 DIAGNOSIS — I10 Essential (primary) hypertension: Secondary | ICD-10-CM | POA: Diagnosis not present

## 2014-09-24 DIAGNOSIS — R739 Hyperglycemia, unspecified: Secondary | ICD-10-CM | POA: Diagnosis not present

## 2014-09-24 DIAGNOSIS — Z1389 Encounter for screening for other disorder: Secondary | ICD-10-CM | POA: Diagnosis not present

## 2014-09-24 DIAGNOSIS — E6609 Other obesity due to excess calories: Secondary | ICD-10-CM | POA: Diagnosis not present

## 2014-09-24 DIAGNOSIS — J301 Allergic rhinitis due to pollen: Secondary | ICD-10-CM | POA: Diagnosis not present

## 2014-09-24 DIAGNOSIS — E782 Mixed hyperlipidemia: Secondary | ICD-10-CM | POA: Diagnosis not present

## 2014-09-24 DIAGNOSIS — K210 Gastro-esophageal reflux disease with esophagitis: Secondary | ICD-10-CM | POA: Diagnosis not present

## 2014-09-24 DIAGNOSIS — Z9189 Other specified personal risk factors, not elsewhere classified: Secondary | ICD-10-CM | POA: Diagnosis not present

## 2014-10-28 DIAGNOSIS — Z961 Presence of intraocular lens: Secondary | ICD-10-CM | POA: Diagnosis not present

## 2014-10-28 DIAGNOSIS — H43392 Other vitreous opacities, left eye: Secondary | ICD-10-CM | POA: Diagnosis not present

## 2014-12-02 DIAGNOSIS — J209 Acute bronchitis, unspecified: Secondary | ICD-10-CM | POA: Diagnosis not present

## 2014-12-02 DIAGNOSIS — J0101 Acute recurrent maxillary sinusitis: Secondary | ICD-10-CM | POA: Diagnosis not present

## 2014-12-11 DIAGNOSIS — R05 Cough: Secondary | ICD-10-CM | POA: Diagnosis not present

## 2014-12-11 DIAGNOSIS — J209 Acute bronchitis, unspecified: Secondary | ICD-10-CM | POA: Diagnosis not present

## 2014-12-17 DIAGNOSIS — Z9012 Acquired absence of left breast and nipple: Secondary | ICD-10-CM | POA: Diagnosis not present

## 2014-12-17 DIAGNOSIS — Z1231 Encounter for screening mammogram for malignant neoplasm of breast: Secondary | ICD-10-CM | POA: Diagnosis not present

## 2015-04-07 DIAGNOSIS — I10 Essential (primary) hypertension: Secondary | ICD-10-CM | POA: Diagnosis not present

## 2015-04-07 DIAGNOSIS — R739 Hyperglycemia, unspecified: Secondary | ICD-10-CM | POA: Diagnosis not present

## 2015-04-07 DIAGNOSIS — E782 Mixed hyperlipidemia: Secondary | ICD-10-CM | POA: Diagnosis not present

## 2015-04-07 DIAGNOSIS — K210 Gastro-esophageal reflux disease with esophagitis: Secondary | ICD-10-CM | POA: Diagnosis not present

## 2015-04-15 DIAGNOSIS — J301 Allergic rhinitis due to pollen: Secondary | ICD-10-CM | POA: Diagnosis not present

## 2015-04-15 DIAGNOSIS — K219 Gastro-esophageal reflux disease without esophagitis: Secondary | ICD-10-CM | POA: Diagnosis not present

## 2015-04-15 DIAGNOSIS — Z0001 Encounter for general adult medical examination with abnormal findings: Secondary | ICD-10-CM | POA: Diagnosis not present

## 2015-04-15 DIAGNOSIS — I10 Essential (primary) hypertension: Secondary | ICD-10-CM | POA: Diagnosis not present

## 2015-04-15 DIAGNOSIS — E6609 Other obesity due to excess calories: Secondary | ICD-10-CM | POA: Diagnosis not present

## 2015-06-29 DIAGNOSIS — M79644 Pain in right finger(s): Secondary | ICD-10-CM | POA: Diagnosis not present

## 2015-06-29 DIAGNOSIS — S61230A Puncture wound without foreign body of right index finger without damage to nail, initial encounter: Secondary | ICD-10-CM | POA: Diagnosis not present

## 2015-06-29 DIAGNOSIS — W5501XA Bitten by cat, initial encounter: Secondary | ICD-10-CM | POA: Diagnosis not present

## 2015-08-23 DIAGNOSIS — L570 Actinic keratosis: Secondary | ICD-10-CM | POA: Diagnosis not present

## 2015-08-23 DIAGNOSIS — C44329 Squamous cell carcinoma of skin of other parts of face: Secondary | ICD-10-CM | POA: Diagnosis not present

## 2015-08-23 DIAGNOSIS — L989 Disorder of the skin and subcutaneous tissue, unspecified: Secondary | ICD-10-CM | POA: Diagnosis not present

## 2015-09-29 DIAGNOSIS — E782 Mixed hyperlipidemia: Secondary | ICD-10-CM | POA: Diagnosis not present

## 2015-09-29 DIAGNOSIS — R739 Hyperglycemia, unspecified: Secondary | ICD-10-CM | POA: Diagnosis not present

## 2015-09-29 DIAGNOSIS — I10 Essential (primary) hypertension: Secondary | ICD-10-CM | POA: Diagnosis not present

## 2015-09-29 DIAGNOSIS — K210 Gastro-esophageal reflux disease with esophagitis: Secondary | ICD-10-CM | POA: Diagnosis not present

## 2015-10-06 DIAGNOSIS — E782 Mixed hyperlipidemia: Secondary | ICD-10-CM | POA: Diagnosis not present

## 2015-10-06 DIAGNOSIS — J301 Allergic rhinitis due to pollen: Secondary | ICD-10-CM | POA: Diagnosis not present

## 2015-10-06 DIAGNOSIS — K219 Gastro-esophageal reflux disease without esophagitis: Secondary | ICD-10-CM | POA: Diagnosis not present

## 2015-10-06 DIAGNOSIS — I10 Essential (primary) hypertension: Secondary | ICD-10-CM | POA: Diagnosis not present

## 2015-10-06 DIAGNOSIS — Z9189 Other specified personal risk factors, not elsewhere classified: Secondary | ICD-10-CM | POA: Diagnosis not present

## 2015-10-06 DIAGNOSIS — Z1389 Encounter for screening for other disorder: Secondary | ICD-10-CM | POA: Diagnosis not present

## 2015-10-06 DIAGNOSIS — R7301 Impaired fasting glucose: Secondary | ICD-10-CM | POA: Diagnosis not present

## 2015-10-06 DIAGNOSIS — E6609 Other obesity due to excess calories: Secondary | ICD-10-CM | POA: Diagnosis not present

## 2015-12-20 DIAGNOSIS — R928 Other abnormal and inconclusive findings on diagnostic imaging of breast: Secondary | ICD-10-CM | POA: Diagnosis not present

## 2015-12-20 DIAGNOSIS — Z1231 Encounter for screening mammogram for malignant neoplasm of breast: Secondary | ICD-10-CM | POA: Diagnosis not present

## 2015-12-28 DIAGNOSIS — H25011 Cortical age-related cataract, right eye: Secondary | ICD-10-CM | POA: Diagnosis not present

## 2015-12-28 DIAGNOSIS — Z961 Presence of intraocular lens: Secondary | ICD-10-CM | POA: Diagnosis not present

## 2015-12-28 DIAGNOSIS — H2511 Age-related nuclear cataract, right eye: Secondary | ICD-10-CM | POA: Diagnosis not present

## 2015-12-28 DIAGNOSIS — H25013 Cortical age-related cataract, bilateral: Secondary | ICD-10-CM | POA: Diagnosis not present

## 2015-12-28 DIAGNOSIS — H2513 Age-related nuclear cataract, bilateral: Secondary | ICD-10-CM | POA: Diagnosis not present

## 2015-12-29 ENCOUNTER — Other Ambulatory Visit: Payer: Self-pay | Admitting: Family Medicine

## 2015-12-29 DIAGNOSIS — Z9012 Acquired absence of left breast and nipple: Secondary | ICD-10-CM | POA: Diagnosis not present

## 2015-12-29 DIAGNOSIS — N6489 Other specified disorders of breast: Secondary | ICD-10-CM | POA: Diagnosis not present

## 2015-12-29 DIAGNOSIS — R928 Other abnormal and inconclusive findings on diagnostic imaging of breast: Secondary | ICD-10-CM | POA: Diagnosis not present

## 2015-12-29 DIAGNOSIS — Z853 Personal history of malignant neoplasm of breast: Secondary | ICD-10-CM | POA: Diagnosis not present

## 2016-01-10 ENCOUNTER — Ambulatory Visit
Admission: RE | Admit: 2016-01-10 | Discharge: 2016-01-10 | Disposition: A | Discharge status: Home / Self Care | Payer: Medicare Other | Source: Ambulatory Visit | Attending: Family Medicine | Admitting: Family Medicine

## 2016-01-10 ENCOUNTER — Other Ambulatory Visit: Payer: Self-pay | Admitting: Family Medicine

## 2016-01-10 DIAGNOSIS — R928 Other abnormal and inconclusive findings on diagnostic imaging of breast: Secondary | ICD-10-CM

## 2016-01-10 DIAGNOSIS — N6011 Diffuse cystic mastopathy of right breast: Secondary | ICD-10-CM | POA: Diagnosis not present

## 2016-02-03 NOTE — Patient Instructions (Signed)
Your procedure is scheduled on: 02/08/2016  Report to [facility] at  40   AM.  Call this number if you have problems the morning of surgery: [phone]   Do not eat food or drink liquids :After Midnight.      Take these medicines the morning of surgery with A SIP OF WATER: losartan, protonix.   Do not wear jewelry, make-up or nail polish.  Do not wear lotions, powders, or perfumes. You may wear deodorant.  Do not shave 48 hours prior to surgery.  Do not bring valuables to the hospital.  Contacts, dentures or bridgework may not be worn into surgery.  Leave suitcase in the car. After surgery it may be brought to your room.  For patients admitted to the hospital, checkout time is 11:00 AM the day of discharge.   Patients discharged the day of surgery will not be allowed to drive home.  :     Please read over the following fact sheets that you were given: Coughing and Deep Breathing, Surgical Site Infection Prevention, Anesthesia Post-op Instructions and Care and Recovery After Surgery    Cataract A cataract is a clouding of the lens of the eye. When a lens becomes cloudy, vision is reduced based on the degree and nature of the clouding. Many cataracts reduce vision to some degree. Some cataracts make people more near-sighted as they develop. Other cataracts increase glare. Cataracts that are ignored and become worse can sometimes look white. The white color can be seen through the pupil. CAUSES   Aging. However, cataracts may occur at any age, even in newborns.   Certain drugs.   Trauma to the eye.   Certain diseases such as diabetes.   Specific eye diseases such as chronic inflammation inside the eye or a sudden attack of a rare form of glaucoma.   Inherited or acquired medical problems.  SYMPTOMS   Gradual, progressive drop in vision in the affected eye.   Severe, rapid visual loss. This most often happens when trauma is the cause.  DIAGNOSIS  To detect a cataract, an eye  doctor examines the lens. Cataracts are best diagnosed with an exam of the eyes with the pupils enlarged (dilated) by drops.  TREATMENT  For an early cataract, vision may improve by using different eyeglasses or stronger lighting. If that does not help your vision, surgery is the only effective treatment. A cataract needs to be surgically removed when vision loss interferes with your everyday activities, such as driving, reading, or watching TV. A cataract may also have to be removed if it prevents examination or treatment of another eye problem. Surgery removes the cloudy lens and usually replaces it with a substitute lens (intraocular lens, IOL).  At a time when both you and your doctor agree, the cataract will be surgically removed. If you have cataracts in both eyes, only one is usually removed at a time. This allows the operated eye to heal and be out of danger from any possible problems after surgery (such as infection or poor wound healing). In rare cases, a cataract may be doing damage to your eye. In these cases, your caregiver may advise surgical removal right away. The vast majority of people who have cataract surgery have better vision afterward. HOME CARE INSTRUCTIONS  If you are not planning surgery, you may be asked to do the following:  Use different eyeglasses.   Use stronger or brighter lighting.   Ask your eye doctor about reducing your medicine  dose or changing medicines if it is thought that a medicine caused your cataract. Changing medicines does not make the cataract go away on its own.   Become familiar with your surroundings. Poor vision can lead to injury. Avoid bumping into things on the affected side. You are at a higher risk for tripping or falling.   Exercise extreme care when driving or operating machinery.   Wear sunglasses if you are sensitive to bright light or experiencing problems with glare.  SEEK IMMEDIATE MEDICAL CARE IF:   You have a worsening or sudden  vision loss.   You notice redness, swelling, or increasing pain in the eye.   You have a fever.  Document Released: 10/02/2005 Document Revised: 09/21/2011 Document Reviewed: 05/26/2011 [facility] Patient Information 2012 [place].PATIENT INSTRUCTIONS POST-ANESTHESIA  IMMEDIATELY FOLLOWING SURGERY:  Do not drive or operate machinery for the first twenty four hours after surgery.  Do not make any important decisions for twenty four hours after surgery or while taking narcotic pain medications or sedatives.  If you develop intractable nausea and vomiting or a severe headache please notify your doctor immediately.  FOLLOW-UP:  Please make an appointment with your surgeon as instructed. You do not need to follow up with anesthesia unless specifically instructed to do so.  WOUND CARE INSTRUCTIONS (if applicable):  Keep a dry clean dressing on the anesthesia/puncture wound site if there is drainage.  Once the wound has quit draining you may leave it open to air.  Generally you should leave the bandage intact for twenty four hours unless there is drainage.  If the epidural site drains for more than 36-48 hours please call the anesthesia department.  QUESTIONS?:  Please feel free to call your physician or the hospital operator if you have any questions, and they will be happy to assist you.

## 2016-02-04 ENCOUNTER — Encounter (HOSPITAL_COMMUNITY)
Admission: RE | Admit: 2016-02-04 | Discharge: 2016-02-04 | Disposition: A | Discharge status: Home / Self Care | Payer: TRICARE For Life (TFL) | Source: Ambulatory Visit | Attending: Ophthalmology | Admitting: Ophthalmology

## 2016-02-04 ENCOUNTER — Encounter (HOSPITAL_COMMUNITY): Payer: Self-pay

## 2016-02-04 DIAGNOSIS — Z01810 Encounter for preprocedural cardiovascular examination: Secondary | ICD-10-CM | POA: Insufficient documentation

## 2016-02-04 DIAGNOSIS — Z01812 Encounter for preprocedural laboratory examination: Secondary | ICD-10-CM | POA: Diagnosis not present

## 2016-02-04 LAB — CBC
HEMATOCRIT: 39 % (ref 36.0–46.0)
HEMOGLOBIN: 13.2 g/dL (ref 12.0–15.0)
MCH: 28.8 pg (ref 26.0–34.0)
MCHC: 33.8 g/dL (ref 30.0–36.0)
MCV: 85.2 fL (ref 78.0–100.0)
Platelets: 227 10*3/uL (ref 150–400)
RBC: 4.58 MIL/uL (ref 3.87–5.11)
RDW: 12.9 % (ref 11.5–15.5)
WBC: 7.9 10*3/uL (ref 4.0–10.5)

## 2016-02-04 LAB — BASIC METABOLIC PANEL
ANION GAP: 11 (ref 5–15)
BUN: 17 mg/dL (ref 6–20)
CO2: 26 mmol/L (ref 22–32)
Calcium: 9.4 mg/dL (ref 8.9–10.3)
Chloride: 98 mmol/L — ABNORMAL LOW (ref 101–111)
Creatinine, Ser: 0.85 mg/dL (ref 0.44–1.00)
GFR calc non Af Amer: >60 mL/min (ref >60)
GLUCOSE: 186 mg/dL — AB (ref 65–99)
POTASSIUM: 3.6 mmol/L (ref 3.5–5.1)
Sodium: 135 mmol/L (ref 135–145)

## 2016-02-08 ENCOUNTER — Ambulatory Visit (HOSPITAL_COMMUNITY): Payer: Medicare Other | Admitting: Anesthesiology

## 2016-02-08 ENCOUNTER — Ambulatory Visit (HOSPITAL_COMMUNITY)
Admission: RE | Admit: 2016-02-08 | Discharge: 2016-02-08 | Disposition: A | Discharge status: Home / Self Care | Payer: Medicare Other | Source: Ambulatory Visit | Attending: Ophthalmology | Admitting: Ophthalmology

## 2016-02-08 ENCOUNTER — Encounter (HOSPITAL_COMMUNITY)
Admission: RE | Disposition: A | Discharge status: Home / Self Care | Payer: Self-pay | Source: Ambulatory Visit | Attending: Ophthalmology

## 2016-02-08 ENCOUNTER — Encounter (HOSPITAL_COMMUNITY): Payer: Self-pay | Admitting: *Deleted

## 2016-02-08 DIAGNOSIS — H268 Other specified cataract: Secondary | ICD-10-CM | POA: Insufficient documentation

## 2016-02-08 DIAGNOSIS — Z7982 Long term (current) use of aspirin: Secondary | ICD-10-CM | POA: Insufficient documentation

## 2016-02-08 DIAGNOSIS — Z79899 Other long term (current) drug therapy: Secondary | ICD-10-CM | POA: Insufficient documentation

## 2016-02-08 DIAGNOSIS — Z6841 Body Mass Index (BMI) 40.0 and over, adult: Secondary | ICD-10-CM | POA: Insufficient documentation

## 2016-02-08 DIAGNOSIS — H269 Unspecified cataract: Secondary | ICD-10-CM | POA: Diagnosis not present

## 2016-02-08 DIAGNOSIS — H2511 Age-related nuclear cataract, right eye: Secondary | ICD-10-CM | POA: Diagnosis not present

## 2016-02-08 DIAGNOSIS — H25011 Cortical age-related cataract, right eye: Secondary | ICD-10-CM | POA: Diagnosis not present

## 2016-02-08 DIAGNOSIS — K219 Gastro-esophageal reflux disease without esophagitis: Secondary | ICD-10-CM | POA: Insufficient documentation

## 2016-02-08 DIAGNOSIS — I10 Essential (primary) hypertension: Secondary | ICD-10-CM | POA: Insufficient documentation

## 2016-02-08 HISTORY — PX: CATARACT EXTRACTION W/PHACO: SHX586

## 2016-02-08 SURGERY — PHACOEMULSIFICATION, CATARACT, WITH IOL INSERTION
Anesthesia: Monitor Anesthesia Care | Site: Eye | Laterality: Right

## 2016-02-08 MED ORDER — KETOROLAC TROMETHAMINE 0.5 % OP SOLN
1.0000 [drp] | OPHTHALMIC | Status: AC
  Administered 2016-02-08 (×3): 1 [drp] via OPHTHALMIC

## 2016-02-08 MED ORDER — EPINEPHRINE HCL 1 MG/ML IJ SOLN
INTRAMUSCULAR | Status: AC
  Filled 2016-02-08: qty 1

## 2016-02-08 MED ORDER — TETRACAINE 0.5 % OP SOLN OPTIME - NO CHARGE
OPHTHALMIC | Status: DC | PRN
  Administered 2016-02-08: 2 [drp] via OPHTHALMIC

## 2016-02-08 MED ORDER — PHENYLEPHRINE HCL 2.5 % OP SOLN
1.0000 [drp] | OPHTHALMIC | Status: AC
  Administered 2016-02-08 (×3): 1 [drp] via OPHTHALMIC

## 2016-02-08 MED ORDER — PROVISC 10 MG/ML IO SOLN
INTRAOCULAR | Status: DC | PRN
  Administered 2016-02-08: 0.85 mL via INTRAOCULAR

## 2016-02-08 MED ORDER — CYCLOPENTOLATE-PHENYLEPHRINE 0.2-1 % OP SOLN
1.0000 [drp] | OPHTHALMIC | Status: AC
  Administered 2016-02-08 (×3): 1 [drp] via OPHTHALMIC

## 2016-02-08 MED ORDER — MIDAZOLAM HCL 2 MG/2ML IJ SOLN
1.0000 mg | INTRAMUSCULAR | Status: DC | PRN
  Administered 2016-02-08: 2 mg via INTRAVENOUS

## 2016-02-08 MED ORDER — MIDAZOLAM HCL 2 MG/2ML IJ SOLN
INTRAMUSCULAR | Status: AC
Start: 2016-02-08 — End: 2016-02-08
  Filled 2016-02-08: qty 2

## 2016-02-08 MED ORDER — EPINEPHRINE HCL 1 MG/ML IJ SOLN
INTRAOCULAR | Status: DC | PRN
  Administered 2016-02-08: 500 mL

## 2016-02-08 MED ORDER — TETRACAINE HCL 0.5 % OP SOLN
1.0000 [drp] | OPHTHALMIC | Status: AC
  Administered 2016-02-08 (×3): 1 [drp] via OPHTHALMIC

## 2016-02-08 MED ORDER — LACTATED RINGERS IV SOLN
INTRAVENOUS | Status: DC
  Administered 2016-02-08: 11:00:00 via INTRAVENOUS

## 2016-02-08 MED ORDER — BSS IO SOLN
INTRAOCULAR | Status: DC | PRN
  Administered 2016-02-08: 15 mL

## 2016-02-08 SURGICAL SUPPLY — 10 items

## 2016-02-08 NOTE — Op Note (Signed)
Patient brought to the operating room and prepped and draped in the usual manner.  Lid speculum inserted in right eye.  Stab incision made at the twelve o'clock position.  Provisc instilled in the anterior chamber.   A 2.4 mm. Stab incision was made temporally.  An anterior capsulotomy was done with a bent 25 gauge needle.  The nucleus was hydrodissected.  The Phaco tip was inserted in the anterior chamber and the nucleus was emulsified.  CDE was 5,56.  The cortical material was then removed with the I and A tip.  Posterior capsule was the polished.  The anterior chamber was deepened with Provisc.  A 22.5 Diopter Alcon [id number] IOL was then inserted in the capsular bag.  Provisc was then removed with the I and A tip.  The wound was then hydrated.  Patient sent to the Recovery Room in good condition with follow up in my office.  Preoperative Diagnosis:  Cortical and Nuclear Cataract OD Postoperative Diagnosis:  Same Procedure name: Kelman Phacoemulsification OD with IOL

## 2016-02-08 NOTE — Transfer of Care (Signed)
Immediate Anesthesia Transfer of Care Note  Patient: Rebecca Gonzalez  Procedure(s) Performed: Procedure(s) with comments: CATARACT EXTRACTION PHACO AND INTRAOCULAR LENS PLACEMENT (IOC) (Right) - CDE:5.56  Patient Location: Short Stay  Anesthesia Type:MAC  Level of Consciousness: awake  Airway & Oxygen Therapy: Patient Spontanous Breathing  Post-op Assessment: Report given to RN  Post vital signs: Reviewed and stable  Last Vitals:  Filed Vitals:   02/08/16 1022  BP: 146/65  Temp: 36.6 C  Resp: 15    Complications: No apparent anesthesia complications

## 2016-02-08 NOTE — H&P (Signed)
The patient was re examined and there is no change in the patients condition since the original H and P. 

## 2016-02-08 NOTE — Discharge Instructions (Signed)
°  °          [facility] Instructions °[address]- [place] [address]-[place] °    ° °1. Avoid closing eyes tightly. One often closes the eye tightly when laughing, talking, sneezing, coughing or if they feel irritated. At these times, you should be careful not to close your eyes tightly. ° °2. Instill eye drops as instructed. To instill drops in your eye, open it, look up and have someone gently pull the lower lid down and instill a couple of drops inside the lower lid. ° °3. Do not touch upper lid. ° °4. Take Advil or Tylenol for pain. ° °5. You may use either eye for near work, such as reading or sewing and you may watch television. ° °6. You may have your hair done at the beauty parlor at any time. ° °7. Wear dark glasses with or without your own glasses if you are in bright light. ° °8. Call our office at [phone] or [phone] if you have sharp pain in your eye or unusual symptoms. ° °9.  FOLLOW UP WITH DR. SHAPIRO TODAY IN HIS [place] OFFICE AT 2:45pm. ° °  °I have received a copy of the above instructions and will follow them.  ° ° ° °IF YOU ARE IN IMMEDIATE DANGER CALL 911! ° °It is important for you to keep your follow-up appointment with your physician after discharge, OR, for you /your caregiver to make a follow-up appointment with your physician / medical provider after discharge. ° °Show these instructions to the next healthcare provider you see. ° °

## 2016-02-08 NOTE — Anesthesia Postprocedure Evaluation (Signed)
Anesthesia Post Note  Patient: Rebecca Gonzalez  Procedure(s) Performed: Procedure(s) (LRB): CATARACT EXTRACTION PHACO AND INTRAOCULAR LENS PLACEMENT (IOC) (Right)  Patient location during evaluation: Short Stay Anesthesia Type: MAC Level of consciousness: awake and alert Pain management: pain level controlled Vital Signs Assessment: post-procedure vital signs reviewed and stable Respiratory status: spontaneous breathing Cardiovascular status: blood pressure returned to baseline Postop Assessment: no signs of nausea or vomiting Anesthetic complications: no    Last Vitals:  Filed Vitals:   02/08/16 1022  BP: 146/65  Temp: 36.6 C  Resp: 15    Last Pain: There were no vitals filed for this visit.               Davida Falconi

## 2016-02-08 NOTE — Anesthesia Preprocedure Evaluation (Addendum)
Anesthesia Evaluation  Patient identified by MRN, date of birth, ID band Patient awake    Reviewed: Allergy & Precautions, NPO status , Patient's Chart, lab work & pertinent test results  Airway Mallampati: III  TM Distance: >3 FB   Mouth opening: Limited Mouth Opening  Dental  (+) Teeth Intact   Pulmonary    Pulmonary exam normal        Cardiovascular hypertension, Pt. on medications Normal cardiovascular exam     Neuro/Psych    GI/Hepatic GERD  Medicated and Controlled,  Endo/Other  Morbid obesity  Renal/GU      Musculoskeletal   Abdominal (+) + obese,   Peds  Hematology   Anesthesia Other Findings   Reproductive/Obstetrics                            Anesthesia Physical Anesthesia Plan  ASA: III  Anesthesia Plan: MAC   Post-op Pain Management:    Induction: Intravenous  Airway Management Planned: Nasal Cannula  Additional Equipment:   Intra-op Plan:   Post-operative Plan:   Informed Consent: I have reviewed the patients History and Physical, chart, labs and discussed the procedure including the risks, benefits and alternatives for the proposed anesthesia with the patient or authorized representative who has indicated his/her understanding and acceptance.   Dental advisory given  Plan Discussed with:   Anesthesia Plan Comments:         Anesthesia Quick Evaluation

## 2016-02-09 ENCOUNTER — Encounter (HOSPITAL_COMMUNITY): Payer: Self-pay | Admitting: Ophthalmology

## 2016-02-29 DIAGNOSIS — R739 Hyperglycemia, unspecified: Secondary | ICD-10-CM | POA: Diagnosis not present

## 2016-02-29 DIAGNOSIS — I10 Essential (primary) hypertension: Secondary | ICD-10-CM | POA: Diagnosis not present

## 2016-02-29 DIAGNOSIS — K210 Gastro-esophageal reflux disease with esophagitis: Secondary | ICD-10-CM | POA: Diagnosis not present

## 2016-02-29 DIAGNOSIS — E782 Mixed hyperlipidemia: Secondary | ICD-10-CM | POA: Diagnosis not present

## 2016-03-07 DIAGNOSIS — R7301 Impaired fasting glucose: Secondary | ICD-10-CM | POA: Diagnosis not present

## 2016-03-07 DIAGNOSIS — K219 Gastro-esophageal reflux disease without esophagitis: Secondary | ICD-10-CM | POA: Diagnosis not present

## 2016-03-07 DIAGNOSIS — E782 Mixed hyperlipidemia: Secondary | ICD-10-CM | POA: Diagnosis not present

## 2016-03-07 DIAGNOSIS — I10 Essential (primary) hypertension: Secondary | ICD-10-CM | POA: Diagnosis not present

## 2016-03-07 DIAGNOSIS — E6609 Other obesity due to excess calories: Secondary | ICD-10-CM | POA: Diagnosis not present

## 2016-03-07 DIAGNOSIS — J301 Allergic rhinitis due to pollen: Secondary | ICD-10-CM | POA: Diagnosis not present

## 2016-06-14 ENCOUNTER — Other Ambulatory Visit: Payer: Self-pay

## 2016-06-27 DIAGNOSIS — Z961 Presence of intraocular lens: Secondary | ICD-10-CM | POA: Diagnosis not present

## 2016-07-07 ENCOUNTER — Other Ambulatory Visit: Payer: Self-pay | Admitting: Family Medicine

## 2016-07-07 DIAGNOSIS — N6489 Other specified disorders of breast: Secondary | ICD-10-CM

## 2016-07-17 ENCOUNTER — Ambulatory Visit
Admission: RE | Admit: 2016-07-17 | Discharge: 2016-07-17 | Disposition: A | Discharge status: Home / Self Care | Payer: Medicare Other | Source: Ambulatory Visit | Attending: Family Medicine | Admitting: Family Medicine

## 2016-07-17 DIAGNOSIS — N6489 Other specified disorders of breast: Secondary | ICD-10-CM

## 2016-07-17 DIAGNOSIS — R928 Other abnormal and inconclusive findings on diagnostic imaging of breast: Secondary | ICD-10-CM | POA: Diagnosis not present

## 2016-08-04 DIAGNOSIS — E782 Mixed hyperlipidemia: Secondary | ICD-10-CM | POA: Diagnosis not present

## 2016-08-04 DIAGNOSIS — I10 Essential (primary) hypertension: Secondary | ICD-10-CM | POA: Diagnosis not present

## 2016-08-04 DIAGNOSIS — R739 Hyperglycemia, unspecified: Secondary | ICD-10-CM | POA: Diagnosis not present

## 2016-08-04 DIAGNOSIS — K210 Gastro-esophageal reflux disease with esophagitis: Secondary | ICD-10-CM | POA: Diagnosis not present

## 2016-08-08 DIAGNOSIS — E6609 Other obesity due to excess calories: Secondary | ICD-10-CM | POA: Diagnosis not present

## 2016-08-08 DIAGNOSIS — R7301 Impaired fasting glucose: Secondary | ICD-10-CM | POA: Diagnosis not present

## 2016-08-08 DIAGNOSIS — Z6841 Body Mass Index (BMI) 40.0 and over, adult: Secondary | ICD-10-CM | POA: Diagnosis not present

## 2016-08-08 DIAGNOSIS — Z1212 Encounter for screening for malignant neoplasm of rectum: Secondary | ICD-10-CM | POA: Diagnosis not present

## 2016-08-08 DIAGNOSIS — E782 Mixed hyperlipidemia: Secondary | ICD-10-CM | POA: Diagnosis not present

## 2016-08-08 DIAGNOSIS — K219 Gastro-esophageal reflux disease without esophagitis: Secondary | ICD-10-CM | POA: Diagnosis not present

## 2016-08-08 DIAGNOSIS — I10 Essential (primary) hypertension: Secondary | ICD-10-CM | POA: Diagnosis not present

## 2016-08-08 DIAGNOSIS — J301 Allergic rhinitis due to pollen: Secondary | ICD-10-CM | POA: Diagnosis not present

## 2016-10-23 DIAGNOSIS — Z880 Allergy status to penicillin: Secondary | ICD-10-CM | POA: Diagnosis not present

## 2016-10-23 DIAGNOSIS — I10 Essential (primary) hypertension: Secondary | ICD-10-CM | POA: Diagnosis present

## 2016-10-23 DIAGNOSIS — E876 Hypokalemia: Secondary | ICD-10-CM | POA: Diagnosis present

## 2016-10-23 DIAGNOSIS — Z79899 Other long term (current) drug therapy: Secondary | ICD-10-CM | POA: Diagnosis not present

## 2016-10-23 DIAGNOSIS — Z886 Allergy status to analgesic agent status: Secondary | ICD-10-CM | POA: Diagnosis not present

## 2016-10-23 DIAGNOSIS — Z6841 Body Mass Index (BMI) 40.0 and over, adult: Secondary | ICD-10-CM | POA: Diagnosis not present

## 2016-10-23 DIAGNOSIS — K219 Gastro-esophageal reflux disease without esophagitis: Secondary | ICD-10-CM | POA: Diagnosis present

## 2016-10-23 DIAGNOSIS — E871 Hypo-osmolality and hyponatremia: Secondary | ICD-10-CM | POA: Diagnosis not present

## 2016-10-23 DIAGNOSIS — J111 Influenza due to unidentified influenza virus with other respiratory manifestations: Secondary | ICD-10-CM | POA: Diagnosis not present

## 2016-10-23 DIAGNOSIS — J189 Pneumonia, unspecified organism: Secondary | ICD-10-CM | POA: Diagnosis not present

## 2016-10-23 DIAGNOSIS — J309 Allergic rhinitis, unspecified: Secondary | ICD-10-CM | POA: Diagnosis present

## 2016-10-23 DIAGNOSIS — J9801 Acute bronchospasm: Secondary | ICD-10-CM | POA: Diagnosis not present

## 2016-10-23 DIAGNOSIS — J181 Lobar pneumonia, unspecified organism: Secondary | ICD-10-CM | POA: Diagnosis not present

## 2016-10-23 DIAGNOSIS — E222 Syndrome of inappropriate secretion of antidiuretic hormone: Secondary | ICD-10-CM | POA: Diagnosis not present

## 2016-10-23 DIAGNOSIS — R0602 Shortness of breath: Secondary | ICD-10-CM | POA: Diagnosis not present

## 2016-10-23 DIAGNOSIS — J00 Acute nasopharyngitis [common cold]: Secondary | ICD-10-CM | POA: Diagnosis not present

## 2016-10-23 DIAGNOSIS — D638 Anemia in other chronic diseases classified elsewhere: Secondary | ICD-10-CM | POA: Diagnosis present

## 2016-10-31 DIAGNOSIS — K219 Gastro-esophageal reflux disease without esophagitis: Secondary | ICD-10-CM | POA: Diagnosis not present

## 2016-10-31 DIAGNOSIS — Z6841 Body Mass Index (BMI) 40.0 and over, adult: Secondary | ICD-10-CM | POA: Diagnosis not present

## 2016-10-31 DIAGNOSIS — I10 Essential (primary) hypertension: Secondary | ICD-10-CM | POA: Diagnosis not present

## 2016-10-31 DIAGNOSIS — J1100 Influenza due to unidentified influenza virus with unspecified type of pneumonia: Secondary | ICD-10-CM | POA: Diagnosis not present

## 2016-10-31 DIAGNOSIS — D649 Anemia, unspecified: Secondary | ICD-10-CM | POA: Diagnosis not present

## 2016-11-03 DIAGNOSIS — I10 Essential (primary) hypertension: Secondary | ICD-10-CM | POA: Diagnosis not present

## 2016-11-03 DIAGNOSIS — Z6841 Body Mass Index (BMI) 40.0 and over, adult: Secondary | ICD-10-CM | POA: Diagnosis not present

## 2016-11-03 DIAGNOSIS — D649 Anemia, unspecified: Secondary | ICD-10-CM | POA: Diagnosis not present

## 2016-11-03 DIAGNOSIS — J1100 Influenza due to unidentified influenza virus with unspecified type of pneumonia: Secondary | ICD-10-CM | POA: Diagnosis not present

## 2016-11-03 DIAGNOSIS — K219 Gastro-esophageal reflux disease without esophagitis: Secondary | ICD-10-CM | POA: Diagnosis not present

## 2016-11-06 DIAGNOSIS — Z6841 Body Mass Index (BMI) 40.0 and over, adult: Secondary | ICD-10-CM | POA: Diagnosis not present

## 2016-11-06 DIAGNOSIS — R05 Cough: Secondary | ICD-10-CM | POA: Diagnosis not present

## 2016-11-06 DIAGNOSIS — H6121 Impacted cerumen, right ear: Secondary | ICD-10-CM | POA: Diagnosis not present

## 2016-11-06 DIAGNOSIS — E871 Hypo-osmolality and hyponatremia: Secondary | ICD-10-CM | POA: Diagnosis not present

## 2016-11-07 DIAGNOSIS — D649 Anemia, unspecified: Secondary | ICD-10-CM | POA: Diagnosis not present

## 2016-11-07 DIAGNOSIS — J1100 Influenza due to unidentified influenza virus with unspecified type of pneumonia: Secondary | ICD-10-CM | POA: Diagnosis not present

## 2016-11-07 DIAGNOSIS — Z6841 Body Mass Index (BMI) 40.0 and over, adult: Secondary | ICD-10-CM | POA: Diagnosis not present

## 2016-11-07 DIAGNOSIS — I10 Essential (primary) hypertension: Secondary | ICD-10-CM | POA: Diagnosis not present

## 2016-11-07 DIAGNOSIS — K219 Gastro-esophageal reflux disease without esophagitis: Secondary | ICD-10-CM | POA: Diagnosis not present

## 2016-11-09 DIAGNOSIS — I10 Essential (primary) hypertension: Secondary | ICD-10-CM | POA: Diagnosis not present

## 2016-11-09 DIAGNOSIS — D649 Anemia, unspecified: Secondary | ICD-10-CM | POA: Diagnosis not present

## 2016-11-09 DIAGNOSIS — J1100 Influenza due to unidentified influenza virus with unspecified type of pneumonia: Secondary | ICD-10-CM | POA: Diagnosis not present

## 2016-11-09 DIAGNOSIS — K219 Gastro-esophageal reflux disease without esophagitis: Secondary | ICD-10-CM | POA: Diagnosis not present

## 2016-11-09 DIAGNOSIS — Z6841 Body Mass Index (BMI) 40.0 and over, adult: Secondary | ICD-10-CM | POA: Diagnosis not present

## 2016-11-14 DIAGNOSIS — H6501 Acute serous otitis media, right ear: Secondary | ICD-10-CM | POA: Diagnosis not present

## 2016-11-14 DIAGNOSIS — Z6841 Body Mass Index (BMI) 40.0 and over, adult: Secondary | ICD-10-CM | POA: Diagnosis not present

## 2016-11-14 DIAGNOSIS — H6991 Unspecified Eustachian tube disorder, right ear: Secondary | ICD-10-CM | POA: Diagnosis not present

## 2016-11-15 DIAGNOSIS — Z6841 Body Mass Index (BMI) 40.0 and over, adult: Secondary | ICD-10-CM | POA: Diagnosis not present

## 2016-11-15 DIAGNOSIS — J1100 Influenza due to unidentified influenza virus with unspecified type of pneumonia: Secondary | ICD-10-CM | POA: Diagnosis not present

## 2016-11-15 DIAGNOSIS — D649 Anemia, unspecified: Secondary | ICD-10-CM | POA: Diagnosis not present

## 2016-11-15 DIAGNOSIS — I10 Essential (primary) hypertension: Secondary | ICD-10-CM | POA: Diagnosis not present

## 2016-11-15 DIAGNOSIS — K219 Gastro-esophageal reflux disease without esophagitis: Secondary | ICD-10-CM | POA: Diagnosis not present

## 2016-11-21 DIAGNOSIS — H6991 Unspecified Eustachian tube disorder, right ear: Secondary | ICD-10-CM | POA: Diagnosis not present

## 2016-11-21 DIAGNOSIS — J1100 Influenza due to unidentified influenza virus with unspecified type of pneumonia: Secondary | ICD-10-CM | POA: Diagnosis not present

## 2016-11-21 DIAGNOSIS — D649 Anemia, unspecified: Secondary | ICD-10-CM | POA: Diagnosis not present

## 2016-11-21 DIAGNOSIS — I10 Essential (primary) hypertension: Secondary | ICD-10-CM | POA: Diagnosis not present

## 2016-11-21 DIAGNOSIS — H6501 Acute serous otitis media, right ear: Secondary | ICD-10-CM | POA: Diagnosis not present

## 2016-11-21 DIAGNOSIS — K219 Gastro-esophageal reflux disease without esophagitis: Secondary | ICD-10-CM | POA: Diagnosis not present

## 2016-11-21 DIAGNOSIS — Z6841 Body Mass Index (BMI) 40.0 and over, adult: Secondary | ICD-10-CM | POA: Diagnosis not present

## 2016-11-28 DIAGNOSIS — E871 Hypo-osmolality and hyponatremia: Secondary | ICD-10-CM | POA: Diagnosis not present

## 2016-11-28 DIAGNOSIS — J1100 Influenza due to unidentified influenza virus with unspecified type of pneumonia: Secondary | ICD-10-CM | POA: Diagnosis not present

## 2016-11-28 DIAGNOSIS — D649 Anemia, unspecified: Secondary | ICD-10-CM | POA: Diagnosis not present

## 2016-11-28 DIAGNOSIS — R05 Cough: Secondary | ICD-10-CM | POA: Diagnosis not present

## 2016-11-28 DIAGNOSIS — I10 Essential (primary) hypertension: Secondary | ICD-10-CM | POA: Diagnosis not present

## 2016-11-28 DIAGNOSIS — Z6841 Body Mass Index (BMI) 40.0 and over, adult: Secondary | ICD-10-CM | POA: Diagnosis not present

## 2016-11-28 DIAGNOSIS — K219 Gastro-esophageal reflux disease without esophagitis: Secondary | ICD-10-CM | POA: Diagnosis not present

## 2016-12-14 ENCOUNTER — Ambulatory Visit (INDEPENDENT_AMBULATORY_CARE_PROVIDER_SITE_OTHER): Payer: Medicare Other | Admitting: Otolaryngology

## 2016-12-14 DIAGNOSIS — H6981 Other specified disorders of Eustachian tube, right ear: Secondary | ICD-10-CM

## 2016-12-14 DIAGNOSIS — H903 Sensorineural hearing loss, bilateral: Secondary | ICD-10-CM

## 2016-12-14 DIAGNOSIS — H9121 Sudden idiopathic hearing loss, right ear: Secondary | ICD-10-CM | POA: Diagnosis not present

## 2016-12-28 ENCOUNTER — Ambulatory Visit (INDEPENDENT_AMBULATORY_CARE_PROVIDER_SITE_OTHER): Payer: Medicare Other | Admitting: Otolaryngology

## 2016-12-28 DIAGNOSIS — H903 Sensorineural hearing loss, bilateral: Secondary | ICD-10-CM | POA: Diagnosis not present

## 2016-12-28 DIAGNOSIS — H6983 Other specified disorders of Eustachian tube, bilateral: Secondary | ICD-10-CM

## 2017-02-08 DIAGNOSIS — K210 Gastro-esophageal reflux disease with esophagitis: Secondary | ICD-10-CM | POA: Diagnosis not present

## 2017-02-08 DIAGNOSIS — E871 Hypo-osmolality and hyponatremia: Secondary | ICD-10-CM | POA: Diagnosis not present

## 2017-02-08 DIAGNOSIS — Z9189 Other specified personal risk factors, not elsewhere classified: Secondary | ICD-10-CM | POA: Diagnosis not present

## 2017-02-08 DIAGNOSIS — R739 Hyperglycemia, unspecified: Secondary | ICD-10-CM | POA: Diagnosis not present

## 2017-02-08 DIAGNOSIS — E782 Mixed hyperlipidemia: Secondary | ICD-10-CM | POA: Diagnosis not present

## 2017-02-08 DIAGNOSIS — I10 Essential (primary) hypertension: Secondary | ICD-10-CM | POA: Diagnosis not present

## 2017-02-13 DIAGNOSIS — Z6841 Body Mass Index (BMI) 40.0 and over, adult: Secondary | ICD-10-CM | POA: Diagnosis not present

## 2017-02-13 DIAGNOSIS — I10 Essential (primary) hypertension: Secondary | ICD-10-CM | POA: Diagnosis not present

## 2017-02-13 DIAGNOSIS — Z0001 Encounter for general adult medical examination with abnormal findings: Secondary | ICD-10-CM | POA: Diagnosis not present

## 2017-02-13 DIAGNOSIS — K219 Gastro-esophageal reflux disease without esophagitis: Secondary | ICD-10-CM | POA: Diagnosis not present

## 2017-02-13 DIAGNOSIS — J301 Allergic rhinitis due to pollen: Secondary | ICD-10-CM | POA: Diagnosis not present

## 2017-02-13 DIAGNOSIS — R7301 Impaired fasting glucose: Secondary | ICD-10-CM | POA: Diagnosis not present

## 2017-02-13 DIAGNOSIS — Z9189 Other specified personal risk factors, not elsewhere classified: Secondary | ICD-10-CM | POA: Diagnosis not present

## 2017-02-13 DIAGNOSIS — E782 Mixed hyperlipidemia: Secondary | ICD-10-CM | POA: Diagnosis not present

## 2017-02-24 DIAGNOSIS — M545 Low back pain: Secondary | ICD-10-CM | POA: Diagnosis not present

## 2017-02-24 DIAGNOSIS — I10 Essential (primary) hypertension: Secondary | ICD-10-CM | POA: Diagnosis not present

## 2017-02-24 DIAGNOSIS — N390 Urinary tract infection, site not specified: Secondary | ICD-10-CM | POA: Diagnosis not present

## 2017-02-24 DIAGNOSIS — Z853 Personal history of malignant neoplasm of breast: Secondary | ICD-10-CM | POA: Diagnosis not present

## 2017-02-24 DIAGNOSIS — Z79899 Other long term (current) drug therapy: Secondary | ICD-10-CM | POA: Diagnosis not present

## 2017-02-24 DIAGNOSIS — K219 Gastro-esophageal reflux disease without esophagitis: Secondary | ICD-10-CM | POA: Diagnosis not present

## 2017-02-26 DIAGNOSIS — B029 Zoster without complications: Secondary | ICD-10-CM | POA: Diagnosis not present

## 2017-02-26 DIAGNOSIS — Z6841 Body Mass Index (BMI) 40.0 and over, adult: Secondary | ICD-10-CM | POA: Diagnosis not present

## 2017-03-23 DIAGNOSIS — B0222 Postherpetic trigeminal neuralgia: Secondary | ICD-10-CM | POA: Diagnosis not present

## 2017-03-23 DIAGNOSIS — Z6841 Body Mass Index (BMI) 40.0 and over, adult: Secondary | ICD-10-CM | POA: Diagnosis not present

## 2017-04-10 DIAGNOSIS — B0222 Postherpetic trigeminal neuralgia: Secondary | ICD-10-CM | POA: Diagnosis not present

## 2017-04-10 DIAGNOSIS — Z6841 Body Mass Index (BMI) 40.0 and over, adult: Secondary | ICD-10-CM | POA: Diagnosis not present

## 2017-07-03 DIAGNOSIS — Z961 Presence of intraocular lens: Secondary | ICD-10-CM | POA: Diagnosis not present

## 2017-07-05 ENCOUNTER — Ambulatory Visit (INDEPENDENT_AMBULATORY_CARE_PROVIDER_SITE_OTHER): Payer: Medicare Other | Admitting: Otolaryngology

## 2017-07-18 ENCOUNTER — Other Ambulatory Visit: Payer: Self-pay | Admitting: Family Medicine

## 2017-07-18 DIAGNOSIS — N6489 Other specified disorders of breast: Secondary | ICD-10-CM

## 2017-07-25 ENCOUNTER — Ambulatory Visit
Admission: RE | Admit: 2017-07-25 | Discharge: 2017-07-25 | Disposition: A | Discharge status: Home / Self Care | Payer: Medicare Other | Source: Ambulatory Visit | Attending: Family Medicine | Admitting: Family Medicine

## 2017-07-25 ENCOUNTER — Other Ambulatory Visit: Payer: Self-pay | Admitting: Family Medicine

## 2017-07-25 DIAGNOSIS — N6489 Other specified disorders of breast: Secondary | ICD-10-CM

## 2017-07-25 DIAGNOSIS — R928 Other abnormal and inconclusive findings on diagnostic imaging of breast: Secondary | ICD-10-CM | POA: Diagnosis not present

## 2017-07-25 DIAGNOSIS — N6310 Unspecified lump in the right breast, unspecified quadrant: Secondary | ICD-10-CM

## 2017-07-25 DIAGNOSIS — N6312 Unspecified lump in the right breast, upper inner quadrant: Secondary | ICD-10-CM | POA: Diagnosis not present

## 2017-07-27 ENCOUNTER — Other Ambulatory Visit: Payer: Medicare Other

## 2017-07-31 ENCOUNTER — Ambulatory Visit
Admission: RE | Admit: 2017-07-31 | Discharge: 2017-07-31 | Disposition: A | Discharge status: Home / Self Care | Payer: Medicare Other | Source: Ambulatory Visit | Attending: Family Medicine | Admitting: Family Medicine

## 2017-07-31 DIAGNOSIS — C50211 Malignant neoplasm of upper-inner quadrant of right female breast: Secondary | ICD-10-CM | POA: Diagnosis not present

## 2017-07-31 DIAGNOSIS — R928 Other abnormal and inconclusive findings on diagnostic imaging of breast: Secondary | ICD-10-CM

## 2017-07-31 DIAGNOSIS — N6312 Unspecified lump in the right breast, upper inner quadrant: Secondary | ICD-10-CM | POA: Diagnosis not present

## 2017-07-31 DIAGNOSIS — N6310 Unspecified lump in the right breast, unspecified quadrant: Secondary | ICD-10-CM

## 2017-08-03 ENCOUNTER — Other Ambulatory Visit: Payer: Self-pay | Admitting: General Surgery

## 2017-08-03 DIAGNOSIS — Z170 Estrogen receptor positive status [ER+]: Principal | ICD-10-CM

## 2017-08-03 DIAGNOSIS — C50211 Malignant neoplasm of upper-inner quadrant of right female breast: Secondary | ICD-10-CM

## 2017-08-13 DIAGNOSIS — K210 Gastro-esophageal reflux disease with esophagitis: Secondary | ICD-10-CM | POA: Diagnosis not present

## 2017-08-13 DIAGNOSIS — I10 Essential (primary) hypertension: Secondary | ICD-10-CM | POA: Diagnosis not present

## 2017-08-13 DIAGNOSIS — E871 Hypo-osmolality and hyponatremia: Secondary | ICD-10-CM | POA: Diagnosis not present

## 2017-08-13 DIAGNOSIS — R7301 Impaired fasting glucose: Secondary | ICD-10-CM | POA: Diagnosis not present

## 2017-08-13 DIAGNOSIS — E782 Mixed hyperlipidemia: Secondary | ICD-10-CM | POA: Diagnosis not present

## 2017-08-13 DIAGNOSIS — Z9189 Other specified personal risk factors, not elsewhere classified: Secondary | ICD-10-CM | POA: Diagnosis not present

## 2017-08-13 DIAGNOSIS — R739 Hyperglycemia, unspecified: Secondary | ICD-10-CM | POA: Diagnosis not present

## 2017-08-14 ENCOUNTER — Encounter (HOSPITAL_BASED_OUTPATIENT_CLINIC_OR_DEPARTMENT_OTHER): Payer: Self-pay | Admitting: *Deleted

## 2017-08-14 NOTE — Progress Notes (Signed)
Received faxed copy of lab results from [facility] but still need patient to come in for EKG prior to surgery.  Her phone mailbox is full and was unable to leave a message.   She know to come pick up Ensure prior to see appointment.

## 2017-08-15 DIAGNOSIS — J301 Allergic rhinitis due to pollen: Secondary | ICD-10-CM | POA: Diagnosis not present

## 2017-08-15 DIAGNOSIS — E782 Mixed hyperlipidemia: Secondary | ICD-10-CM | POA: Diagnosis not present

## 2017-08-15 DIAGNOSIS — K219 Gastro-esophageal reflux disease without esophagitis: Secondary | ICD-10-CM | POA: Diagnosis not present

## 2017-08-15 DIAGNOSIS — Z6841 Body Mass Index (BMI) 40.0 and over, adult: Secondary | ICD-10-CM | POA: Diagnosis not present

## 2017-08-15 DIAGNOSIS — I10 Essential (primary) hypertension: Secondary | ICD-10-CM | POA: Diagnosis not present

## 2017-08-15 DIAGNOSIS — R7301 Impaired fasting glucose: Secondary | ICD-10-CM | POA: Diagnosis not present

## 2017-08-20 ENCOUNTER — Encounter (HOSPITAL_BASED_OUTPATIENT_CLINIC_OR_DEPARTMENT_OTHER)
Admission: RE | Admit: 2017-08-20 | Discharge: 2017-08-20 | Disposition: A | Discharge status: Home / Self Care | Payer: Medicare Other | Source: Ambulatory Visit | Attending: General Surgery | Admitting: General Surgery

## 2017-08-20 DIAGNOSIS — K219 Gastro-esophageal reflux disease without esophagitis: Secondary | ICD-10-CM | POA: Diagnosis not present

## 2017-08-20 DIAGNOSIS — Z9012 Acquired absence of left breast and nipple: Secondary | ICD-10-CM | POA: Diagnosis not present

## 2017-08-20 DIAGNOSIS — C50911 Malignant neoplasm of unspecified site of right female breast: Secondary | ICD-10-CM | POA: Diagnosis not present

## 2017-08-20 DIAGNOSIS — Z170 Estrogen receptor positive status [ER+]: Secondary | ICD-10-CM | POA: Diagnosis not present

## 2017-08-20 DIAGNOSIS — I10 Essential (primary) hypertension: Secondary | ICD-10-CM | POA: Diagnosis not present

## 2017-08-20 DIAGNOSIS — Z79899 Other long term (current) drug therapy: Secondary | ICD-10-CM | POA: Diagnosis not present

## 2017-08-20 NOTE — Progress Notes (Signed)
EKG reviewed by Dr. Marcie Bal, will proceed with surgery as scheduled. Ensure pre surgery drink given with instructions to complete by [facility], pt verbalized understanding.

## 2017-08-22 ENCOUNTER — Ambulatory Visit
Admission: RE | Admit: 2017-08-22 | Discharge: 2017-08-22 | Disposition: A | Discharge status: Home / Self Care | Payer: Medicare Other | Source: Ambulatory Visit | Attending: General Surgery | Admitting: General Surgery

## 2017-08-22 DIAGNOSIS — C50211 Malignant neoplasm of upper-inner quadrant of right female breast: Secondary | ICD-10-CM

## 2017-08-22 DIAGNOSIS — Z170 Estrogen receptor positive status [ER+]: Principal | ICD-10-CM

## 2017-08-23 ENCOUNTER — Encounter (HOSPITAL_BASED_OUTPATIENT_CLINIC_OR_DEPARTMENT_OTHER): Payer: Self-pay | Admitting: Anesthesiology

## 2017-08-23 ENCOUNTER — Ambulatory Visit (HOSPITAL_BASED_OUTPATIENT_CLINIC_OR_DEPARTMENT_OTHER): Payer: Medicare Other | Admitting: Anesthesiology

## 2017-08-23 ENCOUNTER — Encounter (HOSPITAL_BASED_OUTPATIENT_CLINIC_OR_DEPARTMENT_OTHER)
Admission: RE | Disposition: A | Discharge status: Home / Self Care | Payer: Self-pay | Source: Ambulatory Visit | Attending: General Surgery

## 2017-08-23 ENCOUNTER — Ambulatory Visit
Admission: RE | Admit: 2017-08-23 | Discharge: 2017-08-23 | Disposition: A | Discharge status: Home / Self Care | Payer: Medicare Other | Source: Ambulatory Visit | Attending: General Surgery | Admitting: General Surgery

## 2017-08-23 ENCOUNTER — Ambulatory Visit (HOSPITAL_BASED_OUTPATIENT_CLINIC_OR_DEPARTMENT_OTHER)
Admission: RE | Admit: 2017-08-23 | Discharge: 2017-08-23 | Disposition: A | Discharge status: Home / Self Care | Payer: Medicare Other | Source: Ambulatory Visit | Attending: General Surgery | Admitting: General Surgery

## 2017-08-23 ENCOUNTER — Other Ambulatory Visit: Payer: Self-pay

## 2017-08-23 DIAGNOSIS — Z79899 Other long term (current) drug therapy: Secondary | ICD-10-CM | POA: Insufficient documentation

## 2017-08-23 DIAGNOSIS — I10 Essential (primary) hypertension: Secondary | ICD-10-CM | POA: Diagnosis not present

## 2017-08-23 DIAGNOSIS — Z170 Estrogen receptor positive status [ER+]: Secondary | ICD-10-CM | POA: Insufficient documentation

## 2017-08-23 DIAGNOSIS — C50211 Malignant neoplasm of upper-inner quadrant of right female breast: Secondary | ICD-10-CM

## 2017-08-23 DIAGNOSIS — K219 Gastro-esophageal reflux disease without esophagitis: Secondary | ICD-10-CM | POA: Diagnosis not present

## 2017-08-23 DIAGNOSIS — C50911 Malignant neoplasm of unspecified site of right female breast: Secondary | ICD-10-CM | POA: Insufficient documentation

## 2017-08-23 DIAGNOSIS — R928 Other abnormal and inconclusive findings on diagnostic imaging of breast: Secondary | ICD-10-CM | POA: Diagnosis not present

## 2017-08-23 DIAGNOSIS — Z9012 Acquired absence of left breast and nipple: Secondary | ICD-10-CM | POA: Insufficient documentation

## 2017-08-23 HISTORY — PX: BREAST LUMPECTOMY WITH RADIOACTIVE SEED LOCALIZATION: SHX6424

## 2017-08-23 SURGERY — BREAST LUMPECTOMY WITH RADIOACTIVE SEED LOCALIZATION
Anesthesia: General | Site: Breast | Laterality: Right

## 2017-08-23 MED ORDER — ONDANSETRON HCL 4 MG/2ML IJ SOLN: 4.0000 mg | Freq: Once | INTRAMUSCULAR | Status: DC | PRN

## 2017-08-23 MED ORDER — LIDOCAINE 2% (20 MG/ML) 5 ML SYRINGE
INTRAMUSCULAR | Status: AC
Start: 2017-08-23 — End: ?
  Filled 2017-08-23: qty 5

## 2017-08-23 MED ORDER — LACTATED RINGERS IV SOLN
INTRAVENOUS | Status: DC
  Administered 2017-08-23: 09:00:00 via INTRAVENOUS

## 2017-08-23 MED ORDER — BUPIVACAINE HCL (PF) 0.25 % IJ SOLN
INTRAMUSCULAR | Status: DC | PRN
  Administered 2017-08-23: 10 mL

## 2017-08-23 MED ORDER — LACTATED RINGERS IV SOLN: 500.0000 mL | INTRAVENOUS | Status: DC

## 2017-08-23 MED ORDER — FENTANYL CITRATE (PF) 100 MCG/2ML IJ SOLN
50.0000 ug | INTRAMUSCULAR | Status: DC | PRN
  Administered 2017-08-23 (×2): 50 ug via INTRAVENOUS

## 2017-08-23 MED ORDER — ONDANSETRON HCL 4 MG/2ML IJ SOLN
INTRAMUSCULAR | Status: AC
  Filled 2017-08-23: qty 2

## 2017-08-23 MED ORDER — DEXAMETHASONE SODIUM PHOSPHATE 10 MG/ML IJ SOLN
INTRAMUSCULAR | Status: AC
  Filled 2017-08-23: qty 1

## 2017-08-23 MED ORDER — GABAPENTIN 300 MG PO CAPS
ORAL_CAPSULE | ORAL | Status: AC
  Filled 2017-08-23: qty 1

## 2017-08-23 MED ORDER — FENTANYL CITRATE (PF) 100 MCG/2ML IJ SOLN: 25.0000 ug | INTRAMUSCULAR | Status: DC | PRN

## 2017-08-23 MED ORDER — CIPROFLOXACIN IN D5W 400 MG/200ML IV SOLN
400.0000 mg | INTRAVENOUS | Status: AC
  Administered 2017-08-23: 400 mg via INTRAVENOUS

## 2017-08-23 MED ORDER — PROPOFOL 10 MG/ML IV BOLUS
INTRAVENOUS | Status: DC | PRN
  Administered 2017-08-23: 140 mg via INTRAVENOUS

## 2017-08-23 MED ORDER — FENTANYL CITRATE (PF) 100 MCG/2ML IJ SOLN
INTRAMUSCULAR | Status: AC
  Filled 2017-08-23: qty 2

## 2017-08-23 MED ORDER — ACETAMINOPHEN 500 MG PO TABS
1000.0000 mg | ORAL_TABLET | ORAL | Status: AC
  Administered 2017-08-23: 1000 mg via ORAL

## 2017-08-23 MED ORDER — ACETAMINOPHEN 500 MG PO TABS
ORAL_TABLET | ORAL | Status: AC
  Filled 2017-08-23: qty 2

## 2017-08-23 MED ORDER — CIPROFLOXACIN IN D5W 400 MG/200ML IV SOLN
INTRAVENOUS | Status: AC
  Filled 2017-08-23: qty 200

## 2017-08-23 MED ORDER — ONDANSETRON HCL 4 MG/2ML IJ SOLN
INTRAMUSCULAR | Status: DC | PRN
  Administered 2017-08-23: 4 mg via INTRAVENOUS

## 2017-08-23 MED ORDER — EPHEDRINE SULFATE 50 MG/ML IJ SOLN
INTRAMUSCULAR | Status: DC | PRN
  Administered 2017-08-23: 10 mg via INTRAVENOUS

## 2017-08-23 MED ORDER — LIDOCAINE 2% (20 MG/ML) 5 ML SYRINGE
INTRAMUSCULAR | Status: DC | PRN
  Administered 2017-08-23: 60 mg via INTRAVENOUS

## 2017-08-23 MED ORDER — DEXAMETHASONE SODIUM PHOSPHATE 4 MG/ML IJ SOLN
INTRAMUSCULAR | Status: DC | PRN
  Administered 2017-08-23: 10 mg via INTRAVENOUS

## 2017-08-23 MED ORDER — SCOPOLAMINE 1 MG/3DAYS TD PT72: 1.0000 | MEDICATED_PATCH | Freq: Once | TRANSDERMAL | Status: DC | PRN

## 2017-08-23 MED ORDER — GABAPENTIN 300 MG PO CAPS
300.0000 mg | ORAL_CAPSULE | ORAL | Status: AC
  Administered 2017-08-23: 300 mg via ORAL

## 2017-08-23 MED ORDER — MIDAZOLAM HCL 2 MG/2ML IJ SOLN: 1.0000 mg | INTRAMUSCULAR | Status: DC | PRN

## 2017-08-23 MED ORDER — EPHEDRINE 5 MG/ML INJ
INTRAVENOUS | Status: AC
  Filled 2017-08-23: qty 10

## 2017-08-23 SURGICAL SUPPLY — 57 items
APPLIER CLIP 9.375 MED OPEN (MISCELLANEOUS)
BINDER BREAST LRG (GAUZE/BANDAGES/DRESSINGS) IMPLANT
BINDER BREAST MEDIUM (GAUZE/BANDAGES/DRESSINGS) IMPLANT
BINDER BREAST XLRG (GAUZE/BANDAGES/DRESSINGS) IMPLANT
BINDER BREAST XXLRG (GAUZE/BANDAGES/DRESSINGS) ×3 IMPLANT
BLADE SURG 15 STRL LF DISP TIS (BLADE) ×1 IMPLANT
BLADE SURG 15 STRL SS (BLADE) ×2
CANISTER SUC SOCK COL 7IN (MISCELLANEOUS) IMPLANT
CANISTER SUCT 1200ML W/VALVE (MISCELLANEOUS) IMPLANT
CHLORAPREP W/TINT 26ML (MISCELLANEOUS) ×3 IMPLANT
CLIP APPLIE 9.375 MED OPEN (MISCELLANEOUS) IMPLANT
CLIP VESOCCLUDE SM WIDE 6/CT (CLIP) ×3 IMPLANT
CLOSURE WOUND 1/2 X4 (GAUZE/BANDAGES/DRESSINGS) ×1
COVER BACK TABLE 60X90IN (DRAPES) ×3 IMPLANT
COVER MAYO STAND STRL (DRAPES) ×3 IMPLANT
COVER PROBE W GEL 5X96 (DRAPES) ×3 IMPLANT
DECANTER SPIKE VIAL GLASS SM (MISCELLANEOUS) IMPLANT
DERMABOND ADVANCED (GAUZE/BANDAGES/DRESSINGS) ×2
DERMABOND ADVANCED .7 DNX12 (GAUZE/BANDAGES/DRESSINGS) ×1 IMPLANT
DEVICE DUBIN W/COMP PLATE 8390 (MISCELLANEOUS) ×3 IMPLANT
DRAPE LAPAROSCOPIC ABDOMINAL (DRAPES) ×3 IMPLANT
DRAPE UTILITY XL STRL (DRAPES) ×3 IMPLANT
DRSG TEGADERM 4X4.75 (GAUZE/BANDAGES/DRESSINGS) IMPLANT
ELECT COATED BLADE 2.86 ST (ELECTRODE) ×3 IMPLANT
ELECT REM PT RETURN 9FT ADLT (ELECTROSURGICAL) ×3
ELECTRODE REM PT RTRN 9FT ADLT (ELECTROSURGICAL) ×1 IMPLANT
GAUZE SPONGE 4X4 12PLY STRL LF (GAUZE/BANDAGES/DRESSINGS) IMPLANT
GLOVE BIO SURGEON STRL SZ7 (GLOVE) ×6 IMPLANT
GLOVE BIOGEL PI IND STRL 7.5 (GLOVE) ×1 IMPLANT
GLOVE BIOGEL PI INDICATOR 7.5 (GLOVE) ×2
GOWN STRL REUS W/ TWL LRG LVL3 (GOWN DISPOSABLE) ×2 IMPLANT
GOWN STRL REUS W/TWL LRG LVL3 (GOWN DISPOSABLE) ×4
HEMOSTAT ARISTA ABSORB 3G PWDR (MISCELLANEOUS) ×3 IMPLANT
ILLUMINATOR WAVEGUIDE N/F (MISCELLANEOUS) IMPLANT
KIT MARKER MARGIN INK (KITS) ×3 IMPLANT
LIGHT WAVEGUIDE WIDE FLAT (MISCELLANEOUS) IMPLANT
NEEDLE HYPO 25X1 1.5 SAFETY (NEEDLE) ×3 IMPLANT
NS IRRIG 1000ML POUR BTL (IV SOLUTION) IMPLANT
PACK BASIN DAY SURGERY FS (CUSTOM PROCEDURE TRAY) ×3 IMPLANT
PENCIL BUTTON HOLSTER BLD 10FT (ELECTRODE) ×3 IMPLANT
SLEEVE SCD COMPRESS KNEE MED (MISCELLANEOUS) ×3 IMPLANT
SPONGE LAP 4X18 X RAY DECT (DISPOSABLE) ×3 IMPLANT
STRIP CLOSURE SKIN 1/2X4 (GAUZE/BANDAGES/DRESSINGS) ×2 IMPLANT
SUT MNCRL AB 4-0 PS2 18 (SUTURE) ×3 IMPLANT
SUT MON AB 5-0 PS2 18 (SUTURE) ×3 IMPLANT
SUT SILK 2 0 SH (SUTURE) IMPLANT
SUT VIC AB 2-0 SH 27 (SUTURE) ×2
SUT VIC AB 2-0 SH 27XBRD (SUTURE) ×1 IMPLANT
SUT VIC AB 3-0 SH 27 (SUTURE) ×2
SUT VIC AB 3-0 SH 27X BRD (SUTURE) ×1 IMPLANT
SUT VIC AB 5-0 PS2 18 (SUTURE) IMPLANT
SYR CONTROL 10ML LL (SYRINGE) ×3 IMPLANT
TOWEL OR 17X24 6PK STRL BLUE (TOWEL DISPOSABLE) ×3 IMPLANT
TOWEL OR NON WOVEN STRL DISP B (DISPOSABLE) ×3 IMPLANT
TUBE CONNECTING 20'X1/4 (TUBING)
TUBE CONNECTING 20X1/4 (TUBING) IMPLANT
YANKAUER SUCT BULB TIP NO VENT (SUCTIONS) IMPLANT

## 2017-08-23 NOTE — Anesthesia Preprocedure Evaluation (Addendum)
Anesthesia Evaluation  Patient identified by MRN, date of birth, ID band Patient awake    Reviewed: Allergy & Precautions, NPO status , Patient's Chart, lab work & pertinent test results  Airway Mallampati: II  TM Distance: <3 FB Neck ROM: Full    Dental  (+) Dental Advisory Given, Partial Upper   Pulmonary neg pulmonary ROS,    Pulmonary exam normal breath sounds clear to auscultation       Cardiovascular hypertension, Pt. on medications Normal cardiovascular exam Rhythm:Regular Rate:Normal     Neuro/Psych negative neurological ROS  negative psych ROS   GI/Hepatic Neg liver ROS, GERD  Medicated,  Endo/Other  Morbid obesity  Renal/GU negative Renal ROS     Musculoskeletal negative musculoskeletal ROS (+)   Abdominal   Peds  Hematology negative hematology ROS (+)   Anesthesia Other Findings Day of surgery medications reviewed with the patient.  Right breast cancer  Reproductive/Obstetrics                            Anesthesia Physical Anesthesia Plan  ASA: III  Anesthesia Plan: General   Post-op Pain Management:    Induction: Intravenous  PONV Risk Score and Plan: 3 and Dexamethasone, Ondansetron and Treatment may vary due to age or medical condition  Airway Management Planned: LMA  Additional Equipment:   Intra-op Plan:   Post-operative Plan: Extubation in OR  Informed Consent: I have reviewed the patients History and Physical, chart, labs and discussed the procedure including the risks, benefits and alternatives for the proposed anesthesia with the patient or authorized representative who has indicated his/her understanding and acceptance.   Dental advisory given  Plan Discussed with: CRNA  Anesthesia Plan Comments: (Risks/benefits of general anesthesia discussed with patient including risk of damage to teeth, lips, gum, and tongue, nausea/vomiting, allergic reactions to  medications, and the possibility of heart attack, stroke and death.  All patient questions answered.  Patient wishes to proceed.)        Anesthesia Quick Evaluation

## 2017-08-23 NOTE — Interval H&P Note (Signed)
History and Physical Interval Note:  08/23/2017 9:00 AM  Rebecca Gonzalez  has presented today for surgery, with the diagnosis of RIGHT BREAST CANCER  The various methods of treatment have been discussed with the patient and family. After consideration of risks, benefits and other options for treatment, the patient has consented to  Procedure(s): RIGHT BREAST LUMPECTOMY WITH RADIOACTIVE SEED LOCALIZATION (Right) as a surgical intervention .  The patient's history has been reviewed, patient examined, no change in status, stable for surgery.  I have reviewed the patient's chart and labs.  Questions were answered to the patient's satisfaction.     Harmonee Tozer

## 2017-08-23 NOTE — Anesthesia Postprocedure Evaluation (Signed)
Anesthesia Post Note  Patient: ROCKY RISHEL  Procedure(s) Performed: RIGHT BREAST LUMPECTOMY WITH RADIOACTIVE SEED LOCALIZATION (Right Breast)     Patient location during evaluation: PACU Anesthesia Type: General Level of consciousness: awake and alert Pain management: pain level controlled Vital Signs Assessment: post-procedure vital signs reviewed and stable Respiratory status: spontaneous breathing, nonlabored ventilation and respiratory function stable Cardiovascular status: blood pressure returned to baseline and stable Postop Assessment: no apparent nausea or vomiting Anesthetic complications: no    Last Vitals:  Vitals:   08/23/17 1117 08/23/17 1118  BP: (!) 182/69   Pulse: 62   Resp: 16   Temp:  (!) 36.4 C  SpO2: 99%     Last Pain:  Vitals:   08/23/17 1118  TempSrc: Oral  PainSc:                  Catalina Gravel

## 2017-08-23 NOTE — Transfer of Care (Signed)
Immediate Anesthesia Transfer of Care Note  Patient: Rebecca Gonzalez  Procedure(s) Performed: RIGHT BREAST LUMPECTOMY WITH RADIOACTIVE SEED LOCALIZATION (Right Breast)  Patient Location: PACU  Anesthesia Type:General  Level of Consciousness: awake and sedated  Airway & Oxygen Therapy: Patient Spontanous Breathing and Patient connected to face mask oxygen  Post-op Assessment: Report given to RN and Post -op Vital signs reviewed and stable  Post vital signs: Reviewed and stable  Last Vitals:  Vitals:   08/23/17 0902  BP: (!) 141/84  Pulse: 70  Resp: 16  Temp: 36.8 C  SpO2: 96%    Last Pain:  Vitals:   08/23/17 0902  TempSrc: Oral  PainSc: 0-No pain      Patients Stated Pain Goal: 3 (00/34/91 7915)  Complications: No apparent anesthesia complications

## 2017-08-23 NOTE — Anesthesia Procedure Notes (Signed)
Procedure Name: LMA Insertion Performed by: Ever Halberg W, CRNA Pre-anesthesia Checklist: Patient identified, Emergency Drugs available, Suction available and Patient being monitored Patient Re-evaluated:Patient Re-evaluated prior to induction Oxygen Delivery Method: Circle system utilized Preoxygenation: Pre-oxygenation with 100% oxygen Induction Type: IV induction Ventilation: Mask ventilation without difficulty LMA: LMA inserted LMA Size: 4.0 Number of attempts: 1 Placement Confirmation: positive ETCO2 Tube secured with: Tape Dental Injury: Teeth and Oropharynx as per pre-operative assessment        

## 2017-08-23 NOTE — Discharge Instructions (Signed)
[facility] °Office Phone Number [phone] ° °POST OP INSTRUCTIONS ° °Always review your discharge instruction sheet given to you by the facility where your surgery was performed. ° °IF YOU HAVE DISABILITY OR FAMILY LEAVE FORMS, YOU MUST BRING THEM TO THE OFFICE FOR PROCESSING.  DO NOT GIVE THEM TO YOUR DOCTOR. ° °1. A prescription for pain medication may be given to you upon discharge.  Take your pain medication as prescribed, if needed.  If narcotic pain medicine is not needed, then you may take acetaminophen (Tylenol), naprosyn (Alleve) or ibuprofen (Advil) as needed. °2. Take your usually prescribed medications unless otherwise directed °3. If you need a refill on your pain medication, please contact your pharmacy.  They will contact our office to request authorization.  Prescriptions will not be filled after 5pm or on week-ends. °4. You should eat very light the first 24 hours after surgery, such as soup, crackers, pudding, etc.  Resume your normal diet the day after surgery. °5. Most patients will experience some swelling and bruising in the breast.  Ice packs and a good support bra will help.  Wear the breast binder provided or a sports bra for 72 hours day and night.  After that wear a sports bra during the day until you return to the office. Swelling and bruising can take several days to resolve.  °6. It is common to experience some constipation if taking pain medication after surgery.  Increasing fluid intake and taking a stool softener will usually help or prevent this problem from occurring.  A mild laxative (Milk of Magnesia or Miralax) should be taken according to package directions if there are no bowel movements after 48 hours. °7. Unless discharge instructions indicate otherwise, you may remove your bandages 48 hours after surgery and you may shower at that time.  You may have steri-strips (small skin tapes) in place directly over the incision.  These strips should be left on the  skin for 7-10 days and will come off on their own.  If your surgeon used skin glue on the incision, you may shower in 24 hours.  The glue will flake off over the next 2-3 weeks.  Any sutures or staples will be removed at the office during your follow-up visit. °8. ACTIVITIES:  You may resume regular daily activities (gradually increasing) beginning the next day.  Wearing a good support bra or sports bra minimizes pain and swelling.  You may have sexual intercourse when it is comfortable. °a. You may drive when you no longer are taking prescription pain medication, you can comfortably wear a seatbelt, and you can safely maneuver your car and apply brakes. °b. RETURN TO WORK:  ______________________________________________________________________________________ °9. You should see your doctor in the office for a follow-up appointment approximately two weeks after your surgery.  Your doctor’s nurse will typically make your follow-up appointment when she calls you with your pathology report.  Expect your pathology report 3-4 business days after your surgery.  You may call to check if you do not hear from us after three days. °10. OTHER INSTRUCTIONS: _______________________________________________________________________________________________ _____________________________________________________________________________________________________________________________________ °_____________________________________________________________________________________________________________________________________ °_____________________________________________________________________________________________________________________________________ ° °WHEN TO CALL DR [facility]FIELD: °1. Fever over 101.0 °2. Nausea and/or vomiting. °3. Extreme swelling or bruising. °4. Continued bleeding from incision. °5. Increased pain, redness, or drainage from the incision. ° °The clinic staff is available to answer your questions during regular  business hours.  Please don’t hesitate to call and ask to speak to one of the nurses for clinical concerns.  If   you have a medical emergency, go to the nearest emergency room or call 911.  A surgeon from [facility] is always on call at the hospital. ° °For further questions, please visit [email or web address] ° ° ° ° ° °Post Anesthesia Home Care Instructions ° °Activity: °Get plenty of rest for the remainder of the day. A responsible individual must stay with you for 24 hours following the procedure.  °For the next 24 hours, DO NOT: °-Drive a car °-Operate machinery °-Drink alcoholic beverages °-Take any medication unless instructed by your physician °-Make any legal decisions or sign important papers. ° °Meals: °Start with liquid foods such as gelatin or soup. Progress to regular foods as tolerated. Avoid greasy, spicy, heavy foods. If nausea and/or vomiting occur, drink only clear liquids until the nausea and/or vomiting subsides. Call your physician if vomiting continues. ° °Special Instructions/Symptoms: °Your throat may feel dry or sore from the anesthesia or the breathing tube placed in your throat during surgery. If this causes discomfort, gargle with warm salt water. The discomfort should disappear within 24 hours. ° °If you had a scopolamine patch placed behind your ear for the management of post- operative nausea and/or vomiting: ° °1. The medication in the patch is effective for 72 hours, after which it should be removed.  Wrap patch in a tissue and discard in the trash. Wash hands thoroughly with soap and water. °2. You may remove the patch earlier than 72 hours if you experience unpleasant side effects which may include dry mouth, dizziness or visual disturbances. °3. Avoid touching the patch. Wash your hands with soap and water after contact with the patch. °  ° °

## 2017-08-23 NOTE — Op Note (Signed)
Preoperative diagnosis: Right breast cancer, clinical stage I Postoperative diagnosis: same as above Procedure: Rightbreast seed guided lumpectomy Surgeon: Dr Serita Grammes EBL: minimal Anes: general  Specimens  1/. Right breast tissue marked with paint 2. Additional superior, inferior and lateral margins marked short superior, long lateral double deep Complications none Drains none Sponge count correct Dispo to pacu stable  Indications: This is an 24 yof with a newly diagnosed clinical stage I er/pr positive right breast cancer. I have previously taking care of her left breast cancer. We discussed options and have elected to proceed with seed guided lumpectomy/  Procedure: After informed consent was obtained the patient was taken to the operating room. Marland Kitchen She was given antibiotics. Sequential compression devices were on her legs. She was then placed under general anesthesia with an LMA. Then she was prepped and draped in the standard sterile surgical fashion. Surgical timeout was then performed.  I then located the seed in the upper inner right breast I infiltrated marcaine in the skin and then made a periareolar incision in an attempt to hide the scar later.I then used the neoprobe to remove the seed and the surrounding tissue with attempt to get clear margins. I marked this with paint. MM confirmed removal of seed and the clip. I did remove some additional margins as above as the seed counts were higher in these areas.  I placed clips in the cavity.I then obtained hemostasis. I approximated the breast tissue with 2-0 vicryl. The skin was closed with 3-0 vicryl and 5-0 monocryl. Glue and steristrips were applied.

## 2017-08-23 NOTE — H&P (Signed)
82 yof referred by Dr Theda Sers for new right breast cancer. she has prior left mastectomy in 2012 for low grade IDC with hg dcis, sn negative. this was er/pr positive. she did not undergo any further therapy per medical oncology. she underwent screening mm that shows b density breasts. there is an irregular spiculated mass in the upper inner quadrant. US shows a 1.2x0.9x0.9 cm mass. US of the axilla is negative. this tumor is invasive ductal carcinoma that is er/pr positive, her 2 pending and Ki is 10%. she is here to discuss options she lives in [place] and would like to do adjuvant treatment near home. will set her up with oncology there at some point. will await her2 status. this past year she has been hospitalized for the flu and has had shingles  Past Surgical History Illene Regulus, CMA; 08/03/2017 9:07 AM) Breast Biopsy  Bilateral. Cataract Surgery  Bilateral. Gallbladder Surgery - Open  Mastectomy  Left.  Diagnostic Studies History Illene Regulus, CMA; 08/03/2017 9:07 AM) Mammogram  within last year Pap Smear  >5 years ago  Allergies Malachy Moan, RMA; 08/03/2017 8:32 AM) No Known Allergies 08/03/2017  Medication History Malachy Moan, RMA; 08/03/2017 8:33 AM) Losartan Potassium-HCTZ (100-25MG Tablet, Oral) Active. Pantoprazole Sodium (40MG Tablet DR, Oral) Active. Furosemide (20MG Tablet, Oral) Active. Multivitamins (Oral) Active. Fish Oil (1200MG Capsule, Oral) Active. Vitamin D (400UNIT Capsule, Oral) Active. Hyzaar (50-12.5MG Tablet, Oral) Active. Medications Reconciled  Social History Illene Regulus, CMA; 08/03/2017 9:07 AM) Alcohol use  Occasional alcohol use. Caffeine use  Coffee. No drug use  Tobacco use  Never smoker.  Family History Illene Regulus, [place]; 08/03/2017 9:07 AM) Prostate Cancer  Father.  Pregnancy / Birth History Illene Regulus, CMA; 08/03/2017 9:07 AM) Age at menarche  52 years. Age of menopause   61-55 Grav[place]  2 Maternal age  33-20 Para  2  Other Problems Illene Regulus, CMA; 08/03/2017 9:07 AM) Breast Cancer  Gastric Ulcer  Gastroesophageal Reflux Disease  High blood pressure   Review of Systems (Alisha Spillers CMA; 08/03/2017 9:07 AM) General Not Present- Appetite Loss, Chills, Fatigue, Fever, Night Sweats, Weight Gain and Weight Loss. Skin Not Present- Change in Wart/Mole, Dryness, Hives, Jaundice, New Lesions, Non-Healing Wounds, Rash and Ulcer. Respiratory Not Present- Bloody sputum, Chronic Cough, Difficulty Breathing, Snoring and Wheezing. Breast Not Present- Breast Mass, Breast Pain, Nipple Discharge and Skin Changes. Cardiovascular Not Present- Chest Pain, Difficulty Breathing Lying Down, Leg Cramps, Palpitations, Rapid Heart Rate, Shortness of Breath and Swelling of Extremities. Gastrointestinal Not Present- Abdominal Pain, Bloating, Bloody Stool, Change in Bowel Habits, Chronic diarrhea, Constipation, Difficulty Swallowing, Excessive gas, Gets full quickly at meals, Hemorrhoids, Indigestion, Nausea, Rectal Pain and Vomiting. Female Genitourinary Not Present- Frequency, Nocturia, Painful Urination, Pelvic Pain and Urgency. Musculoskeletal Not Present- Back Pain, Joint Pain, Joint Stiffness, Muscle Pain, Muscle Weakness and Swelling of Extremities. Neurological Not Present- Decreased Memory, Fainting, Headaches, Numbness, Seizures, Tingling, Tremor, Trouble walking and Weakness. Psychiatric Not Present- Anxiety, Bipolar, Change in Sleep Pattern, Depression, Fearful and Frequent crying. Endocrine Not Present- Cold Intolerance, Excessive Hunger, Hair Changes, Heat Intolerance, Hot flashes and New Diabetes. Hematology Not Present- Blood Thinners, Easy Bruising, Excessive bleeding, Gland problems, HIV and Persistent Infections.  Vitals Malachy Moan RMA; 08/03/2017 8:34 AM) 08/03/2017 8:34 AM Weight: 226.8 lb Height: 62in Body Surface Area: 2.02 m Body  Mass Index: 41.48 kg/m  Temp.: 97.18F  Pulse: 77 (Regular)  BP: 160/90 (Sitting, Left Arm, Standard) Physical Exam Rolm Bookbinder MD; 08/03/2017 9:18 AM) General Mental  Status-Alert. Orientation-Oriented X3. Head and Neck Trachea-midline. Thyroid Gland Characteristics - normal size and consistency. Eye Sclera/Conjunctiva - Bilateral-No scleral icterus. Chest and Lung Exam Chest and lung exam reveals -quiet, even and easy respiratory effort with no use of accessory muscles and on auscultation, normal breath sounds, no adventitious sounds and normal vocal resonance. Breast Nipples Discharge - Right - None. Breast Lump-No Palpable Breast Mass. Cardiovascular Cardiovascular examination reveals -normal heart sounds, regular rate and rhythm with no murmurs. Abdomen Note: soft nt no hepatomegaly Lymphatic Head & Neck General Head & Neck Lymphatics: Bilateral - Description - Normal. Axillary General Axillary Region: Bilateral - Description - Normal. Note: no [place] adenopathy   Assessment & Plan Rolm Bookbinder MD; 08/03/2017 9:27 AM) BREAST CANCER OF UPPER-INNER QUADRANT OF RIGHT FEMALE BREAST (C50.211) Story: Right breast seed guided lumpectomy We discussed the staging and pathophysiology of breast cancer. We discussed all of the different options for treatment for breast cancer including surgery, chemotherapy, radiation therapy, Herceptin, and antiestrogen therapy. I dont think she needs sn biopsy at this point either as long as her2 negative. We discussed the options for treatment of the breast cancer which included lumpectomy versus a mastectomy. We discussed the performance of the lumpectomy with radioactive seed placement. We discussed a 5-10% chance of a positive margin requiring reexcision in the operating room. I dont think she would likely need radiotherapy given age and tumor.she could likely be managed with antiestrogens alone. We discussed the  mastectomy (removal of whole breast) and the postoperative care for that as well. Mastectomy can be followed by reconstruction. This is a more extensive surgery and requires more recovery. The decision for lumpectomy vs mastectomy has no impact on decision for chemotherapy. Most mastectomy patients will not need radiation therapy. We discussed that there is no difference in her survival whether she undergoes lumpectomy with radiation therapy or antiestrogen therapy versus a mastectomy. There is also no real difference between her recurrence in the breast. We discussed the risks of operation including bleeding, infection, possible reoperation. She understands her further therapy will be based on what her stages at the time of her operation.

## 2017-08-24 ENCOUNTER — Encounter (HOSPITAL_BASED_OUTPATIENT_CLINIC_OR_DEPARTMENT_OTHER): Payer: Self-pay | Admitting: General Surgery

## 2017-10-01 DIAGNOSIS — C50919 Malignant neoplasm of unspecified site of unspecified female breast: Secondary | ICD-10-CM | POA: Diagnosis not present

## 2017-10-01 DIAGNOSIS — C50912 Malignant neoplasm of unspecified site of left female breast: Secondary | ICD-10-CM | POA: Diagnosis not present

## 2017-10-01 DIAGNOSIS — Z170 Estrogen receptor positive status [ER+]: Secondary | ICD-10-CM | POA: Diagnosis not present

## 2017-10-01 DIAGNOSIS — M810 Age-related osteoporosis without current pathological fracture: Secondary | ICD-10-CM | POA: Diagnosis not present

## 2017-10-01 DIAGNOSIS — C50911 Malignant neoplasm of unspecified site of right female breast: Secondary | ICD-10-CM | POA: Diagnosis not present

## 2017-10-18 ENCOUNTER — Other Ambulatory Visit (HOSPITAL_COMMUNITY): Payer: Self-pay | Admitting: Oncology

## 2017-10-18 DIAGNOSIS — Z170 Estrogen receptor positive status [ER+]: Principal | ICD-10-CM

## 2017-10-18 DIAGNOSIS — C50111 Malignant neoplasm of central portion of right female breast: Secondary | ICD-10-CM

## 2017-10-31 DIAGNOSIS — C50919 Malignant neoplasm of unspecified site of unspecified female breast: Secondary | ICD-10-CM | POA: Diagnosis not present

## 2017-11-01 ENCOUNTER — Ambulatory Visit (HOSPITAL_COMMUNITY)
Admission: RE | Admit: 2017-11-01 | Discharge: 2017-11-01 | Disposition: A | Discharge status: Home / Self Care | Payer: Medicare Other | Source: Ambulatory Visit | Attending: Oncology | Admitting: Oncology

## 2017-11-01 DIAGNOSIS — Z79899 Other long term (current) drug therapy: Secondary | ICD-10-CM | POA: Insufficient documentation

## 2017-11-01 DIAGNOSIS — R590 Localized enlarged lymph nodes: Secondary | ICD-10-CM | POA: Insufficient documentation

## 2017-11-01 DIAGNOSIS — Z170 Estrogen receptor positive status [ER+]: Secondary | ICD-10-CM | POA: Insufficient documentation

## 2017-11-01 DIAGNOSIS — K5730 Diverticulosis of large intestine without perforation or abscess without bleeding: Secondary | ICD-10-CM | POA: Diagnosis not present

## 2017-11-01 DIAGNOSIS — I700 Atherosclerosis of aorta: Secondary | ICD-10-CM | POA: Diagnosis not present

## 2017-11-01 DIAGNOSIS — C50911 Malignant neoplasm of unspecified site of right female breast: Secondary | ICD-10-CM | POA: Diagnosis not present

## 2017-11-01 DIAGNOSIS — C50111 Malignant neoplasm of central portion of right female breast: Secondary | ICD-10-CM | POA: Diagnosis not present

## 2017-11-01 DIAGNOSIS — R918 Other nonspecific abnormal finding of lung field: Secondary | ICD-10-CM | POA: Insufficient documentation

## 2017-11-01 LAB — GLUCOSE, CAPILLARY: GLUCOSE-CAPILLARY: 100 mg/dL — AB (ref 65–99)

## 2017-11-01 MED ORDER — FLUDEOXYGLUCOSE F - 18 (FDG) INJECTION
11.4000 | Freq: Once | INTRAVENOUS | Status: AC | PRN
  Administered 2017-11-01: 11.4 via INTRAVENOUS

## 2017-11-15 DIAGNOSIS — C50911 Malignant neoplasm of unspecified site of right female breast: Secondary | ICD-10-CM | POA: Diagnosis not present

## 2017-12-04 DIAGNOSIS — C50211 Malignant neoplasm of upper-inner quadrant of right female breast: Secondary | ICD-10-CM | POA: Diagnosis not present

## 2017-12-05 DIAGNOSIS — M1712 Unilateral primary osteoarthritis, left knee: Secondary | ICD-10-CM | POA: Diagnosis not present

## 2017-12-05 DIAGNOSIS — Z6841 Body Mass Index (BMI) 40.0 and over, adult: Secondary | ICD-10-CM | POA: Diagnosis not present

## 2017-12-06 IMAGING — US ULTRASOUND RIGHT BREAST LIMITED
1 series · 11 of 11 positions shown · non-contrast
Comparison: Previous exam(s).

CLINICAL DATA: Followup stereotactic guided core needle biopsy on
01/10/2016 of an area of asymmetry in the medial right breast with
pathological findings of fibrocystic changes and fat
necrosis.Previous left mastectomy for breast cancer.

EXAM:
2D DIGITAL DIAGNOSTIC RIGHT MAMMOGRAM WITH CAD AND ADJUNCT TOMO
ULTRASOUND RIGHT BREAST

[Series 1: ultrasound right breast limited · 0.07mm/px · 11 of 11 slices shown]
[im 1/11]
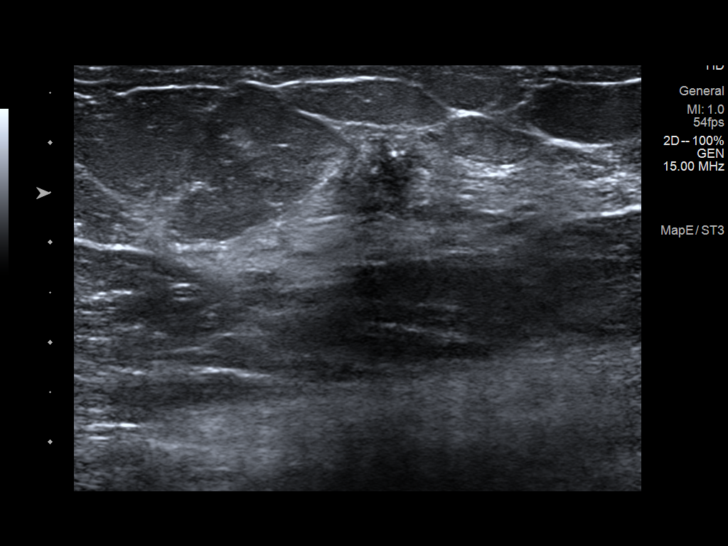
[im 2/11]
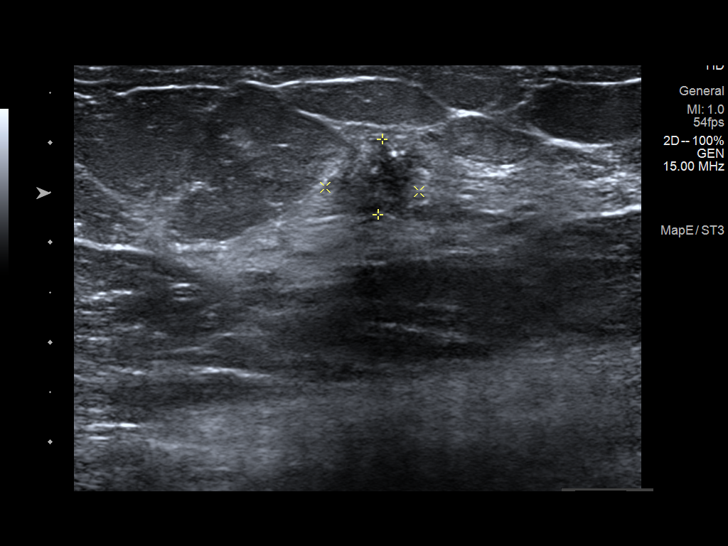
[im 3/11]
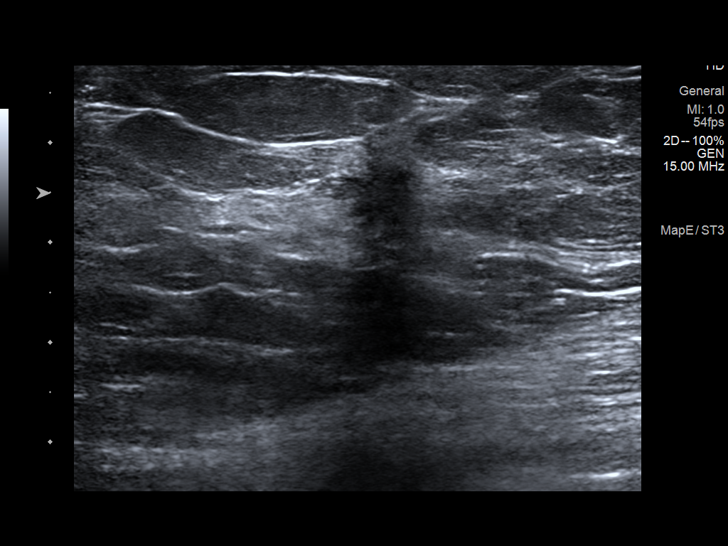
[im 4/11]
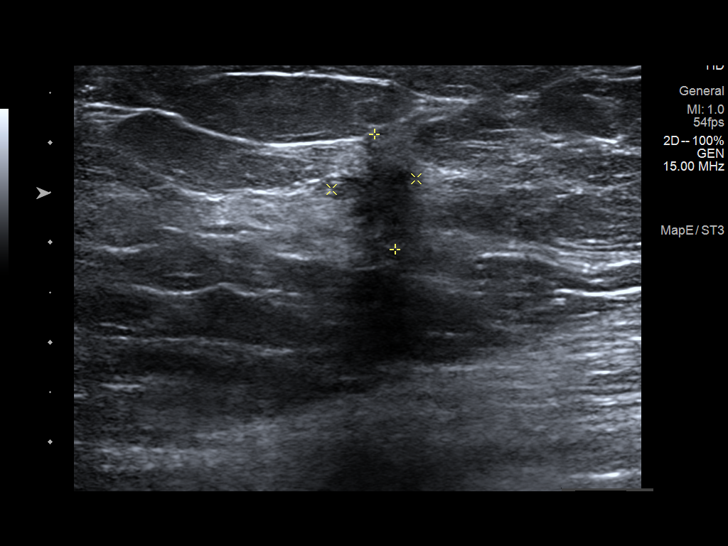
[im 5/11]
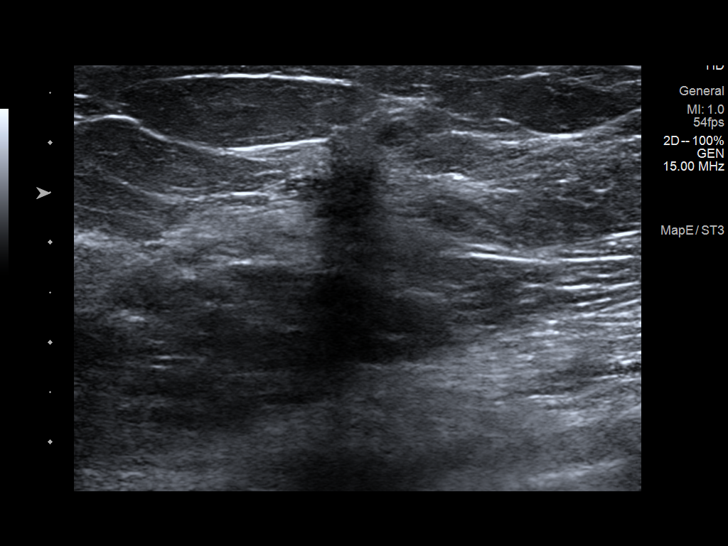
[im 6/11]
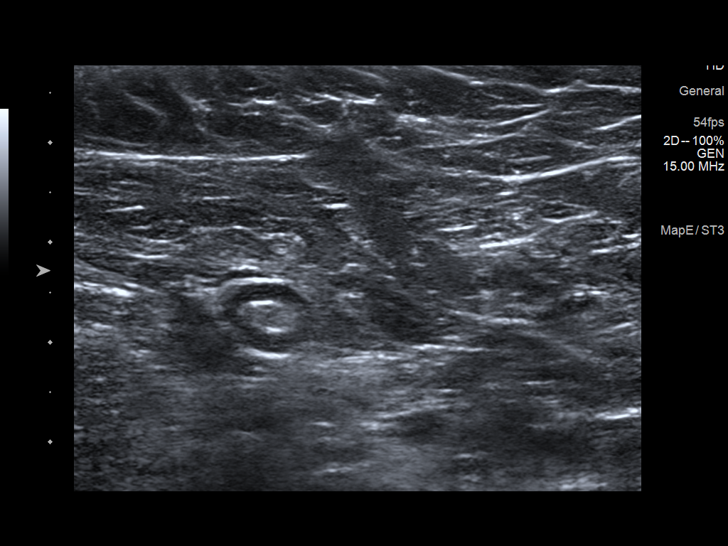
[im 7/11]
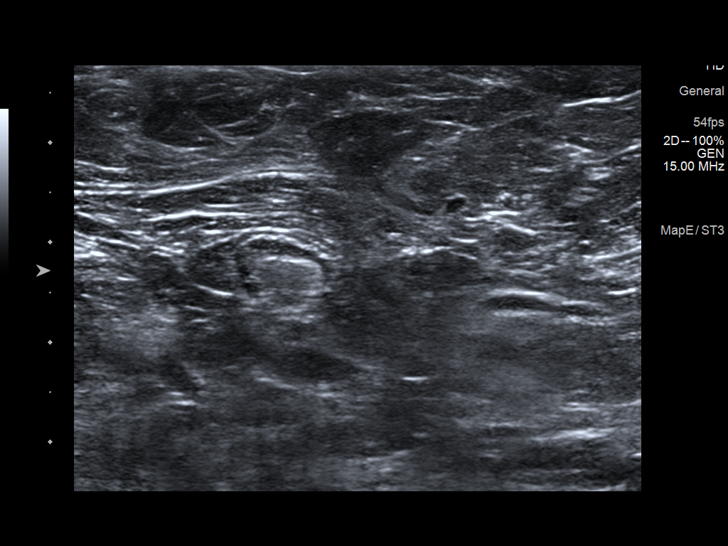
[im 8/11]
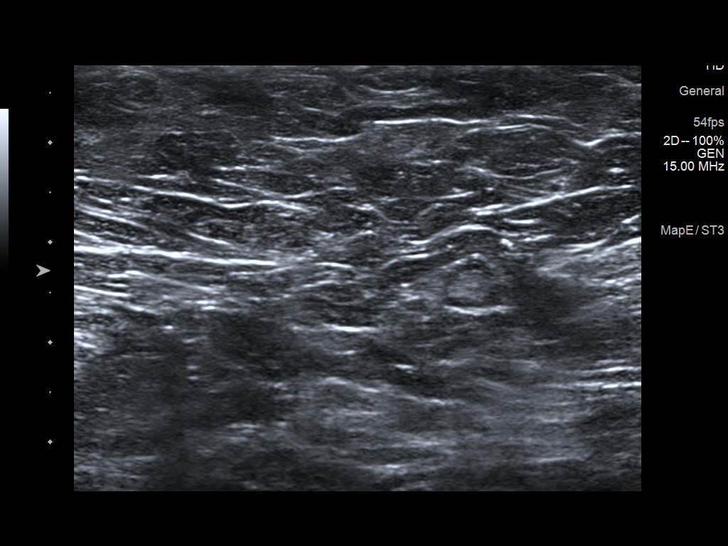
[im 9/11]
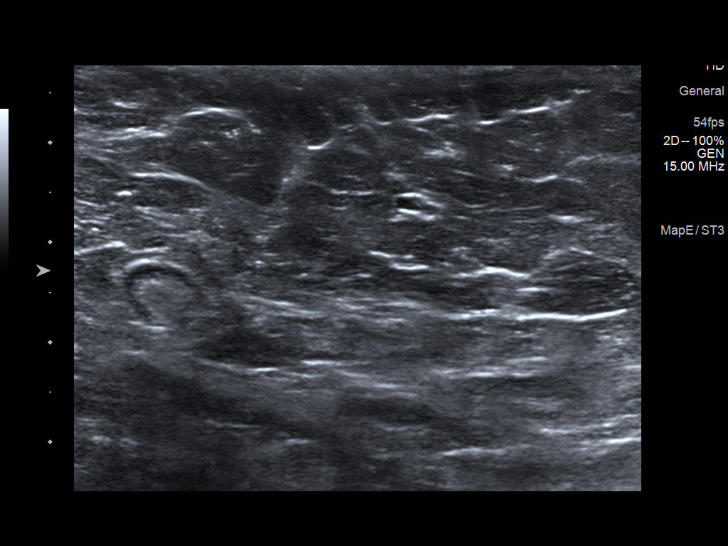
[im 10/11]
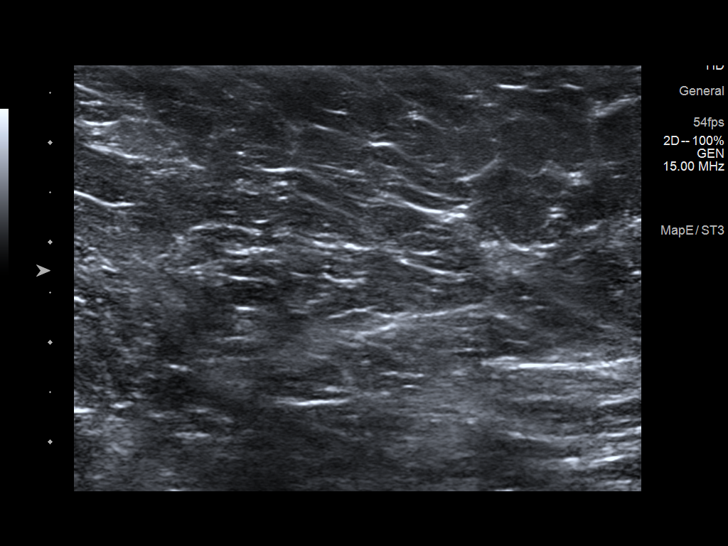
[im 11/11]
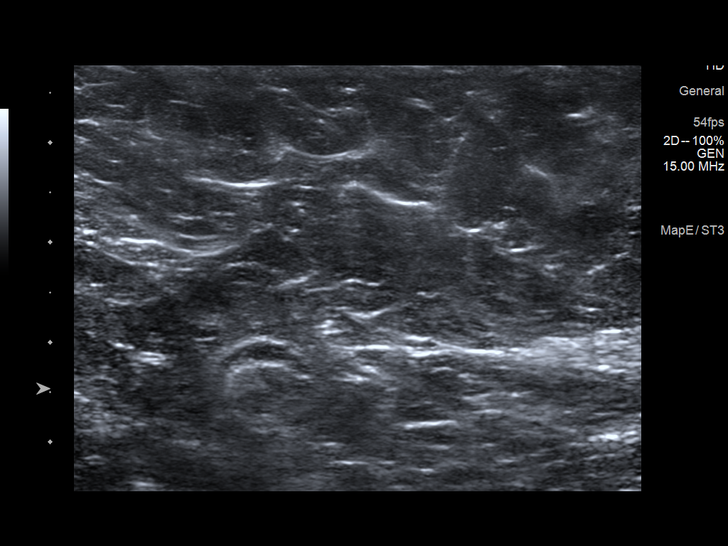

[11 of 11 positions shown; findings below may reference images not displayed]

ACR Breast Density Category b: There are scattered areas of
fibroglandular density.
FINDINGS: 2D and 3D tomographic images of the right breast demonstrate an
irregular, spiculated mass containing an X shaped biopsy marker clip
in the upper inner quadrant of the right breast. This appears more
mass-like than on the patient's previous examinations. No findings
elsewhere in the right breast suspicious for malignancy.

Mammographic images were processed with CAD.

On physical exam, there is an approximately 1.5 cm oval area of mild
palpable soft tissue thickening in the 1 o'clock position of the
right breast, 3 cm from the nipple. There are no palpable right
axillary lymph nodes.

Targeted ultrasound is performed, showing a 1.2 x 0.9 x 0.9 cm
irregular, hypoechoic mass with posterior acoustical shadowing in
the 1 o'clock position of the right breast, 3 cm from the nipple.

Ultrasound of the right axilla demonstrated normal appearing right
axillary lymph nodes.
IMPRESSION: 1.2 cm mass in the 1 o'clock position of the right breast with
imaging features highly suspicious for malignancy.

RECOMMENDATION:
Ultrasound-guided core needle biopsy of the 1.2 cm mass in the 1
o'clock position of the right breast. This has been discussed with
the patient and scheduled at [DATE] a.m. on 07/27/2017.

I have discussed the findings and recommendations with the patient.
Results were also provided in writing at the conclusion of the
visit. If applicable, a reminder letter will be sent to the patient
regarding the next appointment.

BI-RADS CATEGORY  5: Highly suggestive of malignancy.

## 2017-12-18 DIAGNOSIS — Z780 Asymptomatic menopausal state: Secondary | ICD-10-CM | POA: Diagnosis not present

## 2017-12-18 DIAGNOSIS — Z7952 Long term (current) use of systemic steroids: Secondary | ICD-10-CM | POA: Diagnosis not present

## 2017-12-18 DIAGNOSIS — C50911 Malignant neoplasm of unspecified site of right female breast: Secondary | ICD-10-CM | POA: Diagnosis not present

## 2017-12-18 DIAGNOSIS — M85852 Other specified disorders of bone density and structure, left thigh: Secondary | ICD-10-CM | POA: Diagnosis not present

## 2018-02-12 DIAGNOSIS — Z170 Estrogen receptor positive status [ER+]: Secondary | ICD-10-CM | POA: Diagnosis not present

## 2018-02-12 DIAGNOSIS — C50111 Malignant neoplasm of central portion of right female breast: Secondary | ICD-10-CM | POA: Diagnosis not present

## 2018-02-15 DIAGNOSIS — E871 Hypo-osmolality and hyponatremia: Secondary | ICD-10-CM | POA: Diagnosis not present

## 2018-02-15 DIAGNOSIS — E782 Mixed hyperlipidemia: Secondary | ICD-10-CM | POA: Diagnosis not present

## 2018-02-15 DIAGNOSIS — E6609 Other obesity due to excess calories: Secondary | ICD-10-CM | POA: Diagnosis not present

## 2018-02-15 DIAGNOSIS — Z0001 Encounter for general adult medical examination with abnormal findings: Secondary | ICD-10-CM | POA: Diagnosis not present

## 2018-02-15 DIAGNOSIS — R739 Hyperglycemia, unspecified: Secondary | ICD-10-CM | POA: Diagnosis not present

## 2018-02-15 DIAGNOSIS — I10 Essential (primary) hypertension: Secondary | ICD-10-CM | POA: Diagnosis not present

## 2018-02-15 DIAGNOSIS — R5383 Other fatigue: Secondary | ICD-10-CM | POA: Diagnosis not present

## 2018-02-15 DIAGNOSIS — Z9189 Other specified personal risk factors, not elsewhere classified: Secondary | ICD-10-CM | POA: Diagnosis not present

## 2018-02-19 DIAGNOSIS — J301 Allergic rhinitis due to pollen: Secondary | ICD-10-CM | POA: Diagnosis not present

## 2018-02-19 DIAGNOSIS — K219 Gastro-esophageal reflux disease without esophagitis: Secondary | ICD-10-CM | POA: Diagnosis not present

## 2018-02-19 DIAGNOSIS — Z23 Encounter for immunization: Secondary | ICD-10-CM | POA: Diagnosis not present

## 2018-02-19 DIAGNOSIS — Z6841 Body Mass Index (BMI) 40.0 and over, adult: Secondary | ICD-10-CM | POA: Diagnosis not present

## 2018-02-19 DIAGNOSIS — R7301 Impaired fasting glucose: Secondary | ICD-10-CM | POA: Diagnosis not present

## 2018-02-19 DIAGNOSIS — I10 Essential (primary) hypertension: Secondary | ICD-10-CM | POA: Diagnosis not present

## 2018-02-19 DIAGNOSIS — E782 Mixed hyperlipidemia: Secondary | ICD-10-CM | POA: Diagnosis not present

## 2018-02-19 DIAGNOSIS — Z0001 Encounter for general adult medical examination with abnormal findings: Secondary | ICD-10-CM | POA: Diagnosis not present

## 2018-05-09 DIAGNOSIS — C50211 Malignant neoplasm of upper-inner quadrant of right female breast: Secondary | ICD-10-CM | POA: Diagnosis not present

## 2018-05-10 DIAGNOSIS — C50111 Malignant neoplasm of central portion of right female breast: Secondary | ICD-10-CM | POA: Diagnosis not present

## 2018-05-10 DIAGNOSIS — M810 Age-related osteoporosis without current pathological fracture: Secondary | ICD-10-CM | POA: Diagnosis not present

## 2018-05-10 DIAGNOSIS — Z170 Estrogen receptor positive status [ER+]: Secondary | ICD-10-CM | POA: Diagnosis not present

## 2018-05-10 DIAGNOSIS — C50919 Malignant neoplasm of unspecified site of unspecified female breast: Secondary | ICD-10-CM | POA: Diagnosis not present

## 2018-05-14 DIAGNOSIS — C50911 Malignant neoplasm of unspecified site of right female breast: Secondary | ICD-10-CM | POA: Diagnosis not present

## 2018-05-14 DIAGNOSIS — E119 Type 2 diabetes mellitus without complications: Secondary | ICD-10-CM | POA: Diagnosis not present

## 2018-06-19 DIAGNOSIS — Z6841 Body Mass Index (BMI) 40.0 and over, adult: Secondary | ICD-10-CM | POA: Diagnosis not present

## 2018-06-19 DIAGNOSIS — E782 Mixed hyperlipidemia: Secondary | ICD-10-CM | POA: Diagnosis not present

## 2018-06-19 DIAGNOSIS — J301 Allergic rhinitis due to pollen: Secondary | ICD-10-CM | POA: Diagnosis not present

## 2018-06-19 DIAGNOSIS — K219 Gastro-esophageal reflux disease without esophagitis: Secondary | ICD-10-CM | POA: Diagnosis not present

## 2018-06-19 DIAGNOSIS — I10 Essential (primary) hypertension: Secondary | ICD-10-CM | POA: Diagnosis not present

## 2018-06-19 DIAGNOSIS — R7301 Impaired fasting glucose: Secondary | ICD-10-CM | POA: Diagnosis not present

## 2018-07-09 DIAGNOSIS — Z961 Presence of intraocular lens: Secondary | ICD-10-CM | POA: Diagnosis not present

## 2018-07-09 DIAGNOSIS — H40013 Open angle with borderline findings, low risk, bilateral: Secondary | ICD-10-CM | POA: Diagnosis not present

## 2018-07-16 ENCOUNTER — Other Ambulatory Visit: Payer: Self-pay | Admitting: General Surgery

## 2018-07-16 DIAGNOSIS — Z853 Personal history of malignant neoplasm of breast: Secondary | ICD-10-CM

## 2018-07-26 ENCOUNTER — Ambulatory Visit
Admission: RE | Admit: 2018-07-26 | Discharge: 2018-07-26 | Disposition: A | Discharge status: Home / Self Care | Payer: Medicare Other | Source: Ambulatory Visit | Attending: General Surgery | Admitting: General Surgery

## 2018-07-26 DIAGNOSIS — Z853 Personal history of malignant neoplasm of breast: Secondary | ICD-10-CM

## 2018-07-26 DIAGNOSIS — R928 Other abnormal and inconclusive findings on diagnostic imaging of breast: Secondary | ICD-10-CM | POA: Diagnosis not present

## 2018-09-10 DIAGNOSIS — Z23 Encounter for immunization: Secondary | ICD-10-CM | POA: Diagnosis not present

## 2018-10-23 DIAGNOSIS — C50111 Malignant neoplasm of central portion of right female breast: Secondary | ICD-10-CM | POA: Diagnosis not present

## 2018-10-23 DIAGNOSIS — E119 Type 2 diabetes mellitus without complications: Secondary | ICD-10-CM | POA: Diagnosis not present

## 2018-10-23 DIAGNOSIS — C50911 Malignant neoplasm of unspecified site of right female breast: Secondary | ICD-10-CM | POA: Diagnosis not present

## 2018-10-23 DIAGNOSIS — M810 Age-related osteoporosis without current pathological fracture: Secondary | ICD-10-CM | POA: Diagnosis not present

## 2018-10-23 DIAGNOSIS — Z170 Estrogen receptor positive status [ER+]: Secondary | ICD-10-CM | POA: Diagnosis not present

## 2018-10-23 DIAGNOSIS — Z79811 Long term (current) use of aromatase inhibitors: Secondary | ICD-10-CM | POA: Diagnosis not present

## 2018-12-04 DIAGNOSIS — L304 Erythema intertrigo: Secondary | ICD-10-CM | POA: Diagnosis not present

## 2018-12-04 DIAGNOSIS — Z6841 Body Mass Index (BMI) 40.0 and over, adult: Secondary | ICD-10-CM | POA: Diagnosis not present

## 2018-12-07 IMAGING — MG DIGITAL DIAGNOSTIC UNILATERAL RIGHT MAMMOGRAM WITH TOMO AND CAD
6 series · 6 of 14 positions shown · non-contrast
Comparison: Previous exam(s).

CLINICAL DATA: Status post right lumpectomy for breast cancer in
5623. Currently taking letrozole. Status post left mastectomy for
breast cancer in 9449.

EXAM:
DIGITAL DIAGNOSTIC UNILATERAL RIGHT MAMMOGRAM WITH CAD AND TOMO

[R CC (1 of 2)]
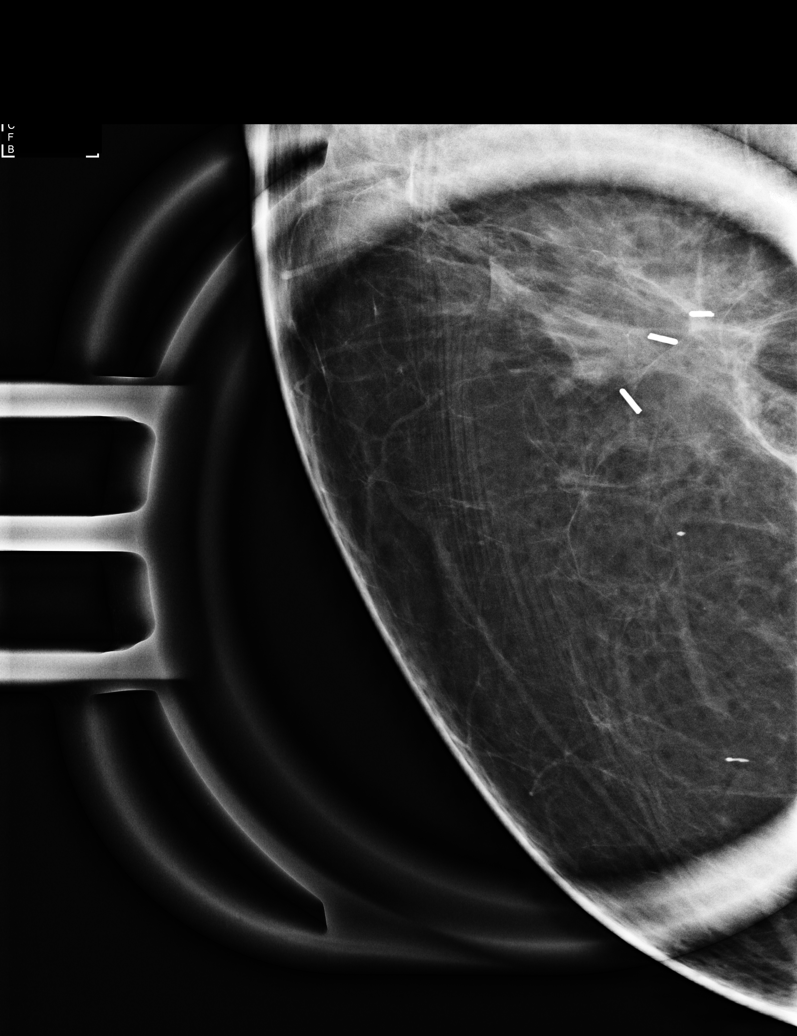

[R CC (2 of 2)]
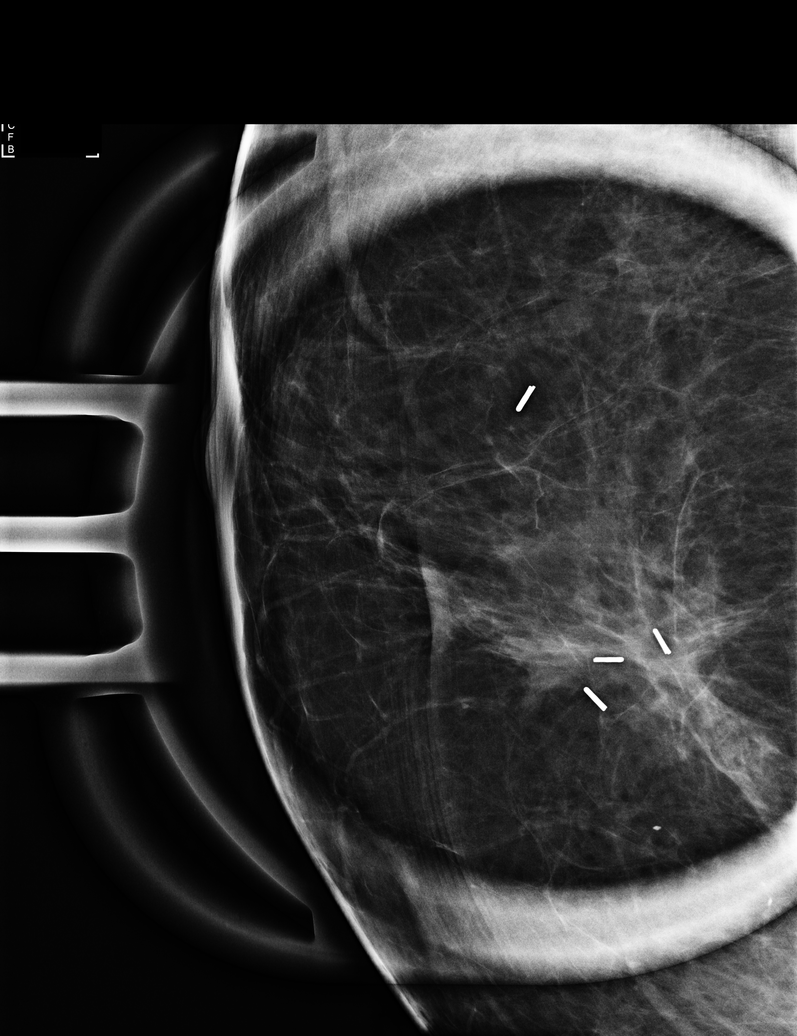

[R MLO synth-2D]
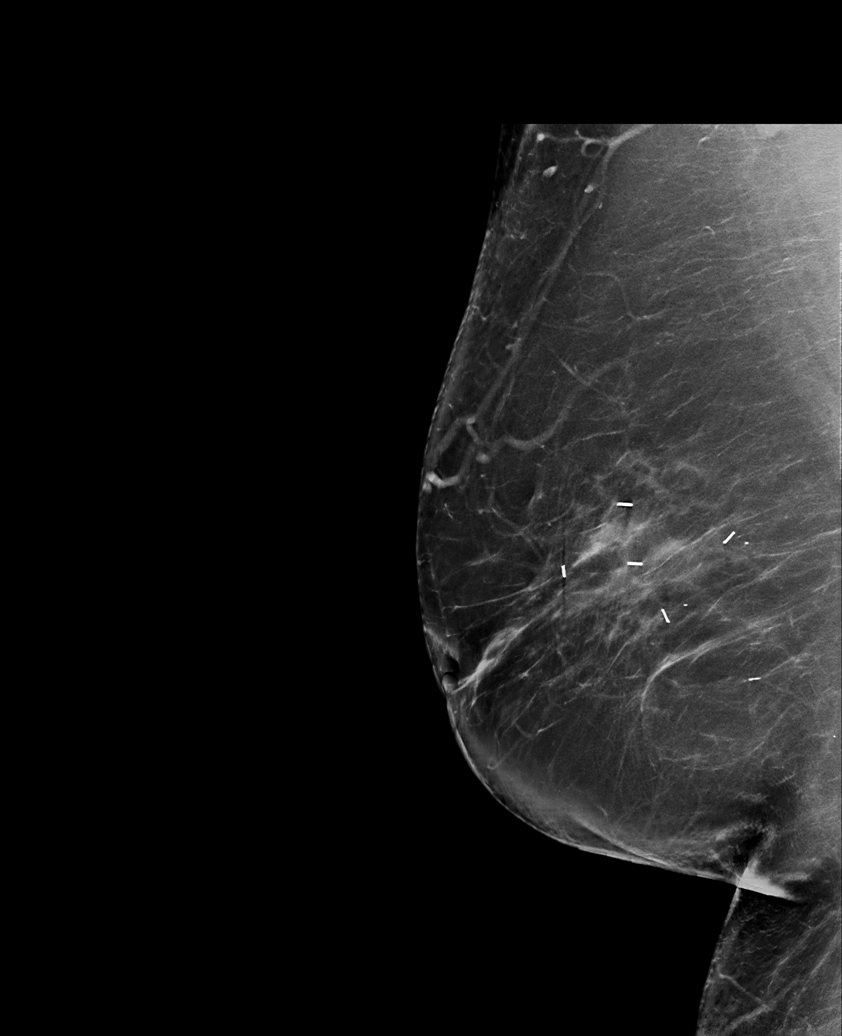

[R CC synth-2D]
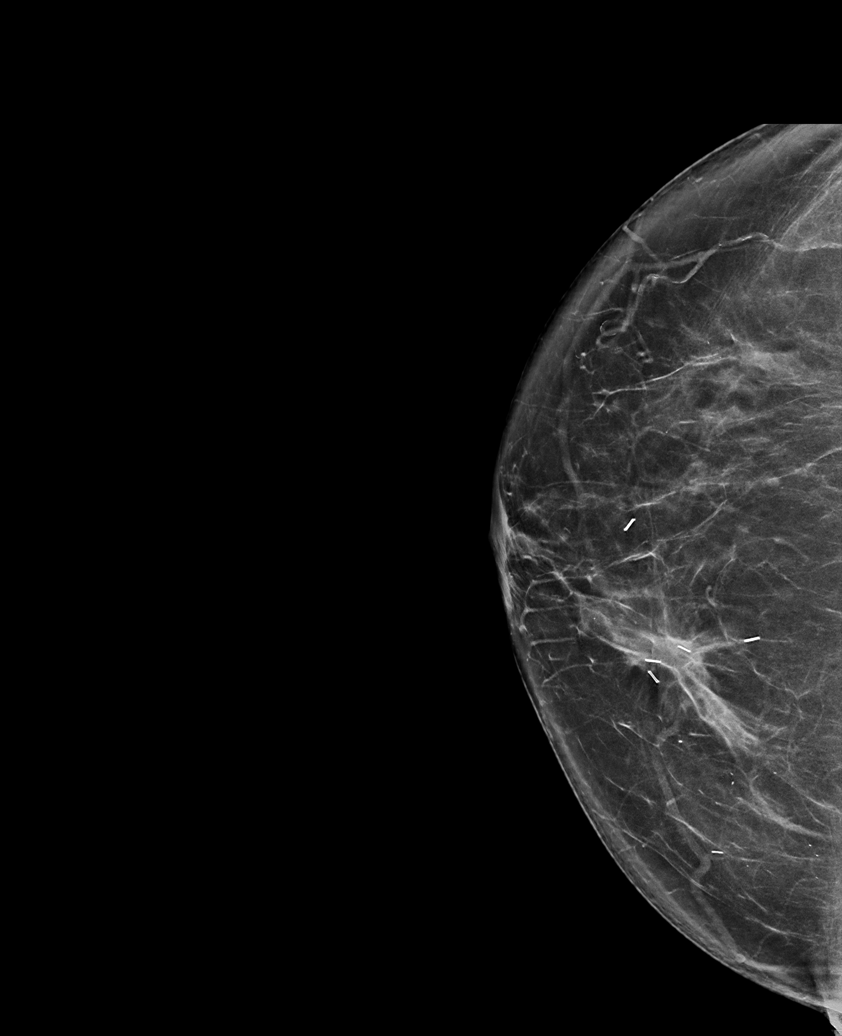

[R CC tomo · tomo slice 41/80.0]
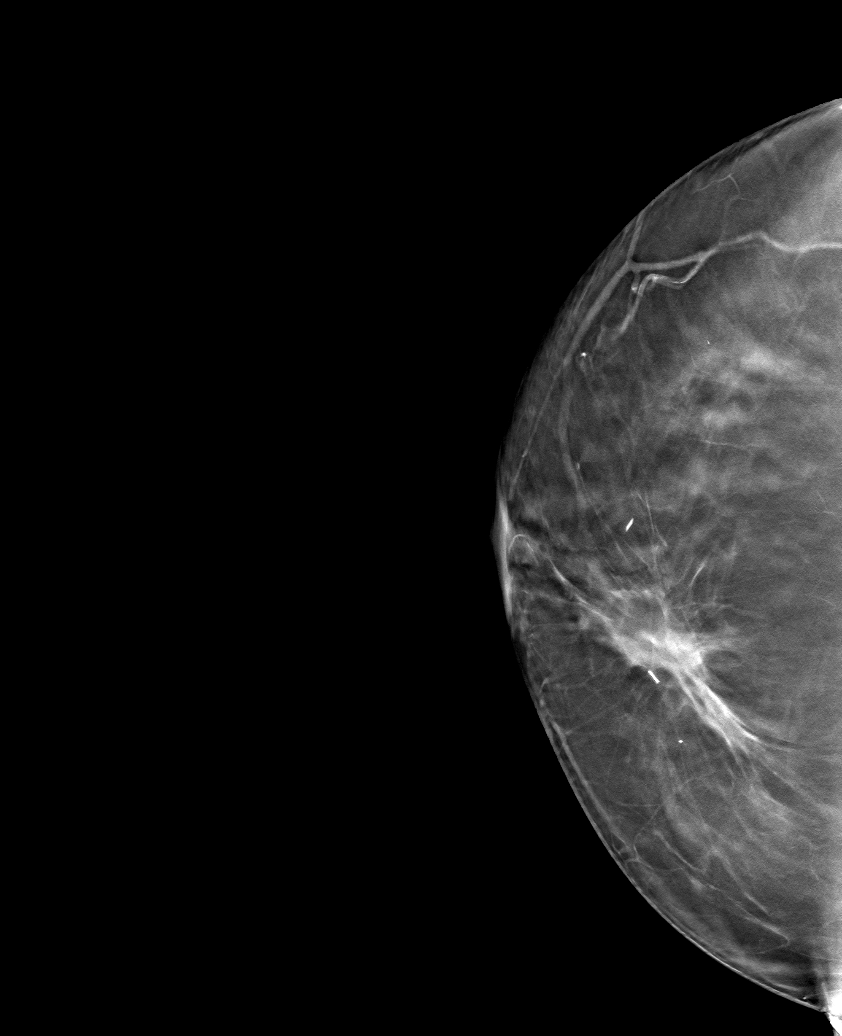

[R MLO tomo · tomo slice 51/100.0]
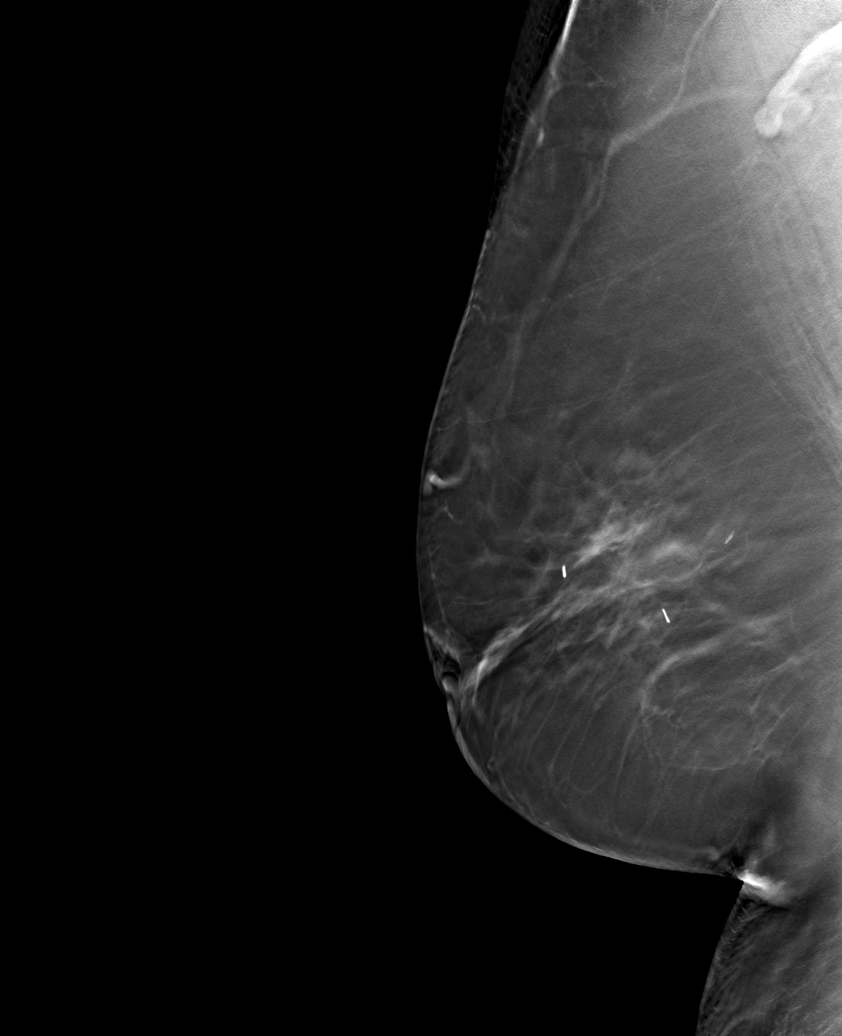

[6 of 14 positions shown; findings below may reference images not displayed]

ACR Breast Density Category b: There are scattered areas of
fibroglandular density.
FINDINGS: Post lumpectomy changes on the right. No findings suspicious for
malignancy.

Mammographic images were processed with CAD.
IMPRESSION: No evidence of malignancy.

RECOMMENDATION:
Right diagnostic mammogram in 1 year.

I have discussed the findings and recommendations with the patient.
Results were also provided in writing at the conclusion of the
visit. If applicable, a reminder letter will be sent to the patient
regarding the next appointment.

BI-RADS CATEGORY  2: Benign.

## 2019-03-12 DIAGNOSIS — R7301 Impaired fasting glucose: Secondary | ICD-10-CM | POA: Diagnosis not present

## 2019-03-12 DIAGNOSIS — K210 Gastro-esophageal reflux disease with esophagitis: Secondary | ICD-10-CM | POA: Diagnosis not present

## 2019-03-12 DIAGNOSIS — E871 Hypo-osmolality and hyponatremia: Secondary | ICD-10-CM | POA: Diagnosis not present

## 2019-03-12 DIAGNOSIS — E782 Mixed hyperlipidemia: Secondary | ICD-10-CM | POA: Diagnosis not present

## 2019-03-12 DIAGNOSIS — R739 Hyperglycemia, unspecified: Secondary | ICD-10-CM | POA: Diagnosis not present

## 2019-03-12 DIAGNOSIS — I10 Essential (primary) hypertension: Secondary | ICD-10-CM | POA: Diagnosis not present

## 2019-03-17 DIAGNOSIS — E782 Mixed hyperlipidemia: Secondary | ICD-10-CM | POA: Diagnosis not present

## 2019-03-17 DIAGNOSIS — Z23 Encounter for immunization: Secondary | ICD-10-CM | POA: Diagnosis not present

## 2019-03-17 DIAGNOSIS — Z6841 Body Mass Index (BMI) 40.0 and over, adult: Secondary | ICD-10-CM | POA: Diagnosis not present

## 2019-03-17 DIAGNOSIS — Z0001 Encounter for general adult medical examination with abnormal findings: Secondary | ICD-10-CM | POA: Diagnosis not present

## 2019-03-17 DIAGNOSIS — J301 Allergic rhinitis due to pollen: Secondary | ICD-10-CM | POA: Diagnosis not present

## 2019-03-17 DIAGNOSIS — R7301 Impaired fasting glucose: Secondary | ICD-10-CM | POA: Diagnosis not present

## 2019-03-17 DIAGNOSIS — I10 Essential (primary) hypertension: Secondary | ICD-10-CM | POA: Diagnosis not present

## 2019-04-23 DIAGNOSIS — C50911 Malignant neoplasm of unspecified site of right female breast: Secondary | ICD-10-CM | POA: Diagnosis not present

## 2019-04-23 DIAGNOSIS — Z170 Estrogen receptor positive status [ER+]: Secondary | ICD-10-CM | POA: Diagnosis not present

## 2019-04-23 DIAGNOSIS — C50111 Malignant neoplasm of central portion of right female breast: Secondary | ICD-10-CM | POA: Diagnosis not present

## 2019-04-23 DIAGNOSIS — Z79811 Long term (current) use of aromatase inhibitors: Secondary | ICD-10-CM | POA: Diagnosis not present

## 2019-06-04 DIAGNOSIS — M5432 Sciatica, left side: Secondary | ICD-10-CM | POA: Diagnosis not present

## 2019-06-04 DIAGNOSIS — Z6841 Body Mass Index (BMI) 40.0 and over, adult: Secondary | ICD-10-CM | POA: Diagnosis not present

## 2019-07-14 ENCOUNTER — Other Ambulatory Visit: Payer: Self-pay | Admitting: General Surgery

## 2019-07-14 DIAGNOSIS — Z9889 Other specified postprocedural states: Secondary | ICD-10-CM

## 2019-07-17 DIAGNOSIS — H52203 Unspecified astigmatism, bilateral: Secondary | ICD-10-CM | POA: Diagnosis not present

## 2019-07-17 DIAGNOSIS — H524 Presbyopia: Secondary | ICD-10-CM | POA: Diagnosis not present

## 2019-07-17 DIAGNOSIS — Z961 Presence of intraocular lens: Secondary | ICD-10-CM | POA: Diagnosis not present

## 2019-07-29 ENCOUNTER — Other Ambulatory Visit: Payer: Self-pay

## 2019-07-29 ENCOUNTER — Ambulatory Visit
Admission: RE | Admit: 2019-07-29 | Discharge: 2019-07-29 | Disposition: A | Discharge status: Home / Self Care | Payer: Medicare Other | Source: Ambulatory Visit | Attending: General Surgery | Admitting: General Surgery

## 2019-07-29 DIAGNOSIS — Z9889 Other specified postprocedural states: Secondary | ICD-10-CM

## 2019-07-29 DIAGNOSIS — R928 Other abnormal and inconclusive findings on diagnostic imaging of breast: Secondary | ICD-10-CM | POA: Diagnosis not present

## 2019-08-07 DIAGNOSIS — M1712 Unilateral primary osteoarthritis, left knee: Secondary | ICD-10-CM | POA: Diagnosis not present

## 2019-08-07 DIAGNOSIS — Z6841 Body Mass Index (BMI) 40.0 and over, adult: Secondary | ICD-10-CM | POA: Diagnosis not present

## 2019-08-15 DIAGNOSIS — I10 Essential (primary) hypertension: Secondary | ICD-10-CM | POA: Diagnosis not present

## 2019-08-15 DIAGNOSIS — R739 Hyperglycemia, unspecified: Secondary | ICD-10-CM | POA: Diagnosis not present

## 2019-08-18 DIAGNOSIS — Z23 Encounter for immunization: Secondary | ICD-10-CM | POA: Diagnosis not present

## 2019-09-15 DIAGNOSIS — E782 Mixed hyperlipidemia: Secondary | ICD-10-CM | POA: Diagnosis not present

## 2019-09-15 DIAGNOSIS — R739 Hyperglycemia, unspecified: Secondary | ICD-10-CM | POA: Diagnosis not present

## 2019-09-15 DIAGNOSIS — R5383 Other fatigue: Secondary | ICD-10-CM | POA: Diagnosis not present

## 2019-09-15 DIAGNOSIS — I10 Essential (primary) hypertension: Secondary | ICD-10-CM | POA: Diagnosis not present

## 2019-09-15 DIAGNOSIS — E871 Hypo-osmolality and hyponatremia: Secondary | ICD-10-CM | POA: Diagnosis not present

## 2019-09-15 DIAGNOSIS — K219 Gastro-esophageal reflux disease without esophagitis: Secondary | ICD-10-CM | POA: Diagnosis not present

## 2019-09-16 DIAGNOSIS — C50111 Malignant neoplasm of central portion of right female breast: Secondary | ICD-10-CM | POA: Diagnosis not present

## 2019-09-16 DIAGNOSIS — Z170 Estrogen receptor positive status [ER+]: Secondary | ICD-10-CM | POA: Diagnosis not present

## 2019-09-16 DIAGNOSIS — Z79811 Long term (current) use of aromatase inhibitors: Secondary | ICD-10-CM | POA: Diagnosis not present

## 2019-09-16 DIAGNOSIS — E119 Type 2 diabetes mellitus without complications: Secondary | ICD-10-CM | POA: Diagnosis not present

## 2019-09-16 DIAGNOSIS — C50911 Malignant neoplasm of unspecified site of right female breast: Secondary | ICD-10-CM | POA: Diagnosis not present

## 2019-09-16 DIAGNOSIS — E559 Vitamin D deficiency, unspecified: Secondary | ICD-10-CM | POA: Diagnosis not present

## 2019-09-18 DIAGNOSIS — Z170 Estrogen receptor positive status [ER+]: Secondary | ICD-10-CM | POA: Diagnosis not present

## 2019-09-18 DIAGNOSIS — C50111 Malignant neoplasm of central portion of right female breast: Secondary | ICD-10-CM | POA: Diagnosis not present

## 2019-09-18 DIAGNOSIS — K219 Gastro-esophageal reflux disease without esophagitis: Secondary | ICD-10-CM | POA: Diagnosis not present

## 2019-09-18 DIAGNOSIS — R7301 Impaired fasting glucose: Secondary | ICD-10-CM | POA: Diagnosis not present

## 2019-09-18 DIAGNOSIS — I10 Essential (primary) hypertension: Secondary | ICD-10-CM | POA: Diagnosis not present

## 2019-09-18 DIAGNOSIS — Z6841 Body Mass Index (BMI) 40.0 and over, adult: Secondary | ICD-10-CM | POA: Diagnosis not present

## 2019-09-18 DIAGNOSIS — M8580 Other specified disorders of bone density and structure, unspecified site: Secondary | ICD-10-CM | POA: Diagnosis not present

## 2019-09-18 DIAGNOSIS — J301 Allergic rhinitis due to pollen: Secondary | ICD-10-CM | POA: Diagnosis not present

## 2019-09-18 DIAGNOSIS — C50911 Malignant neoplasm of unspecified site of right female breast: Secondary | ICD-10-CM | POA: Diagnosis not present

## 2019-09-18 DIAGNOSIS — Z79811 Long term (current) use of aromatase inhibitors: Secondary | ICD-10-CM | POA: Diagnosis not present

## 2019-09-18 DIAGNOSIS — E782 Mixed hyperlipidemia: Secondary | ICD-10-CM | POA: Diagnosis not present

## 2019-09-18 DIAGNOSIS — E119 Type 2 diabetes mellitus without complications: Secondary | ICD-10-CM | POA: Diagnosis not present

## 2019-11-14 DIAGNOSIS — I10 Essential (primary) hypertension: Secondary | ICD-10-CM | POA: Diagnosis not present

## 2019-11-14 DIAGNOSIS — E7849 Other hyperlipidemia: Secondary | ICD-10-CM | POA: Diagnosis not present

## 2019-11-20 DIAGNOSIS — Z23 Encounter for immunization: Secondary | ICD-10-CM | POA: Diagnosis not present

## 2019-12-19 DIAGNOSIS — Z23 Encounter for immunization: Secondary | ICD-10-CM | POA: Diagnosis not present

## 2020-01-12 DIAGNOSIS — E559 Vitamin D deficiency, unspecified: Secondary | ICD-10-CM | POA: Diagnosis not present

## 2020-01-12 DIAGNOSIS — Z170 Estrogen receptor positive status [ER+]: Secondary | ICD-10-CM | POA: Diagnosis not present

## 2020-01-12 DIAGNOSIS — C50111 Malignant neoplasm of central portion of right female breast: Secondary | ICD-10-CM | POA: Diagnosis not present

## 2020-01-12 DIAGNOSIS — M8580 Other specified disorders of bone density and structure, unspecified site: Secondary | ICD-10-CM | POA: Diagnosis not present

## 2020-01-14 DIAGNOSIS — E7849 Other hyperlipidemia: Secondary | ICD-10-CM | POA: Diagnosis not present

## 2020-01-14 DIAGNOSIS — I10 Essential (primary) hypertension: Secondary | ICD-10-CM | POA: Diagnosis not present

## 2020-01-27 DIAGNOSIS — Z1231 Encounter for screening mammogram for malignant neoplasm of breast: Secondary | ICD-10-CM | POA: Diagnosis not present

## 2020-01-27 DIAGNOSIS — M8589 Other specified disorders of bone density and structure, multiple sites: Secondary | ICD-10-CM | POA: Diagnosis not present

## 2020-01-27 DIAGNOSIS — C50911 Malignant neoplasm of unspecified site of right female breast: Secondary | ICD-10-CM | POA: Diagnosis not present

## 2020-01-27 DIAGNOSIS — Z79811 Long term (current) use of aromatase inhibitors: Secondary | ICD-10-CM | POA: Diagnosis not present

## 2020-01-27 DIAGNOSIS — M8580 Other specified disorders of bone density and structure, unspecified site: Secondary | ICD-10-CM | POA: Diagnosis not present

## 2020-02-13 DIAGNOSIS — E7849 Other hyperlipidemia: Secondary | ICD-10-CM | POA: Diagnosis not present

## 2020-02-13 DIAGNOSIS — I10 Essential (primary) hypertension: Secondary | ICD-10-CM | POA: Diagnosis not present

## 2020-03-15 DIAGNOSIS — K219 Gastro-esophageal reflux disease without esophagitis: Secondary | ICD-10-CM | POA: Diagnosis not present

## 2020-03-15 DIAGNOSIS — I10 Essential (primary) hypertension: Secondary | ICD-10-CM | POA: Diagnosis not present

## 2020-03-15 DIAGNOSIS — E7849 Other hyperlipidemia: Secondary | ICD-10-CM | POA: Diagnosis not present

## 2020-03-29 DIAGNOSIS — R7301 Impaired fasting glucose: Secondary | ICD-10-CM | POA: Diagnosis not present

## 2020-03-29 DIAGNOSIS — K2100 Gastro-esophageal reflux disease with esophagitis, without bleeding: Secondary | ICD-10-CM | POA: Diagnosis not present

## 2020-03-29 DIAGNOSIS — E782 Mixed hyperlipidemia: Secondary | ICD-10-CM | POA: Diagnosis not present

## 2020-03-29 DIAGNOSIS — R5383 Other fatigue: Secondary | ICD-10-CM | POA: Diagnosis not present

## 2020-03-29 DIAGNOSIS — E871 Hypo-osmolality and hyponatremia: Secondary | ICD-10-CM | POA: Diagnosis not present

## 2020-03-29 DIAGNOSIS — I10 Essential (primary) hypertension: Secondary | ICD-10-CM | POA: Diagnosis not present

## 2020-05-14 DIAGNOSIS — E7849 Other hyperlipidemia: Secondary | ICD-10-CM | POA: Diagnosis not present

## 2020-05-14 DIAGNOSIS — I10 Essential (primary) hypertension: Secondary | ICD-10-CM | POA: Diagnosis not present

## 2020-05-14 DIAGNOSIS — K219 Gastro-esophageal reflux disease without esophagitis: Secondary | ICD-10-CM | POA: Diagnosis not present

## 2020-05-27 DIAGNOSIS — M8589 Other specified disorders of bone density and structure, multiple sites: Secondary | ICD-10-CM | POA: Diagnosis not present

## 2020-05-27 DIAGNOSIS — M85562 Aneurysmal bone cyst, left lower leg: Secondary | ICD-10-CM | POA: Diagnosis not present

## 2020-05-27 DIAGNOSIS — Z79811 Long term (current) use of aromatase inhibitors: Secondary | ICD-10-CM | POA: Diagnosis not present

## 2020-05-27 DIAGNOSIS — M85852 Other specified disorders of bone density and structure, left thigh: Secondary | ICD-10-CM | POA: Diagnosis not present

## 2020-06-28 ENCOUNTER — Other Ambulatory Visit: Payer: Self-pay | Admitting: Family Medicine

## 2020-06-28 DIAGNOSIS — Z0000 Encounter for general adult medical examination without abnormal findings: Secondary | ICD-10-CM

## 2020-07-19 DIAGNOSIS — H524 Presbyopia: Secondary | ICD-10-CM | POA: Diagnosis not present

## 2020-07-19 DIAGNOSIS — H52203 Unspecified astigmatism, bilateral: Secondary | ICD-10-CM | POA: Diagnosis not present

## 2020-07-19 DIAGNOSIS — Z961 Presence of intraocular lens: Secondary | ICD-10-CM | POA: Diagnosis not present

## 2020-07-29 ENCOUNTER — Ambulatory Visit
Admission: RE | Admit: 2020-07-29 | Discharge: 2020-07-29 | Disposition: A | Discharge status: Home / Self Care | Payer: Medicare Other | Source: Ambulatory Visit | Attending: Family Medicine | Admitting: Family Medicine

## 2020-07-29 ENCOUNTER — Other Ambulatory Visit: Payer: Self-pay | Admitting: Family Medicine

## 2020-07-29 ENCOUNTER — Other Ambulatory Visit: Payer: Self-pay

## 2020-07-29 DIAGNOSIS — Z853 Personal history of malignant neoplasm of breast: Secondary | ICD-10-CM | POA: Diagnosis not present

## 2020-07-29 DIAGNOSIS — Z0000 Encounter for general adult medical examination without abnormal findings: Secondary | ICD-10-CM

## 2020-07-29 DIAGNOSIS — Z9889 Other specified postprocedural states: Secondary | ICD-10-CM

## 2020-07-29 DIAGNOSIS — R928 Other abnormal and inconclusive findings on diagnostic imaging of breast: Secondary | ICD-10-CM | POA: Diagnosis not present

## 2020-08-09 DIAGNOSIS — Z79811 Long term (current) use of aromatase inhibitors: Secondary | ICD-10-CM | POA: Diagnosis not present

## 2020-08-09 DIAGNOSIS — M8580 Other specified disorders of bone density and structure, unspecified site: Secondary | ICD-10-CM | POA: Diagnosis not present

## 2020-08-09 DIAGNOSIS — Z780 Asymptomatic menopausal state: Secondary | ICD-10-CM | POA: Diagnosis not present

## 2020-08-09 DIAGNOSIS — C50919 Malignant neoplasm of unspecified site of unspecified female breast: Secondary | ICD-10-CM | POA: Diagnosis not present

## 2020-08-12 DIAGNOSIS — M8580 Other specified disorders of bone density and structure, unspecified site: Secondary | ICD-10-CM | POA: Diagnosis not present

## 2020-08-12 DIAGNOSIS — Z9229 Personal history of other drug therapy: Secondary | ICD-10-CM | POA: Diagnosis not present

## 2020-08-12 DIAGNOSIS — Z780 Asymptomatic menopausal state: Secondary | ICD-10-CM | POA: Diagnosis not present

## 2020-08-12 DIAGNOSIS — Z79811 Long term (current) use of aromatase inhibitors: Secondary | ICD-10-CM | POA: Diagnosis not present

## 2020-08-12 DIAGNOSIS — Z23 Encounter for immunization: Secondary | ICD-10-CM | POA: Diagnosis not present

## 2020-08-12 DIAGNOSIS — C50911 Malignant neoplasm of unspecified site of right female breast: Secondary | ICD-10-CM | POA: Diagnosis not present

## 2020-09-06 DIAGNOSIS — Z23 Encounter for immunization: Secondary | ICD-10-CM | POA: Diagnosis not present

## 2020-09-16 DIAGNOSIS — Z23 Encounter for immunization: Secondary | ICD-10-CM | POA: Diagnosis not present

## 2020-10-05 DIAGNOSIS — E871 Hypo-osmolality and hyponatremia: Secondary | ICD-10-CM | POA: Diagnosis not present

## 2020-10-05 DIAGNOSIS — E1165 Type 2 diabetes mellitus with hyperglycemia: Secondary | ICD-10-CM | POA: Diagnosis not present

## 2020-10-05 DIAGNOSIS — E782 Mixed hyperlipidemia: Secondary | ICD-10-CM | POA: Diagnosis not present

## 2020-10-05 DIAGNOSIS — K219 Gastro-esophageal reflux disease without esophagitis: Secondary | ICD-10-CM | POA: Diagnosis not present

## 2020-10-07 DIAGNOSIS — J301 Allergic rhinitis due to pollen: Secondary | ICD-10-CM | POA: Diagnosis not present

## 2020-10-07 DIAGNOSIS — R0602 Shortness of breath: Secondary | ICD-10-CM | POA: Diagnosis not present

## 2020-10-07 DIAGNOSIS — K219 Gastro-esophageal reflux disease without esophagitis: Secondary | ICD-10-CM | POA: Diagnosis not present

## 2020-10-07 DIAGNOSIS — I10 Essential (primary) hypertension: Secondary | ICD-10-CM | POA: Diagnosis not present

## 2020-10-07 DIAGNOSIS — Z6841 Body Mass Index (BMI) 40.0 and over, adult: Secondary | ICD-10-CM | POA: Diagnosis not present

## 2020-10-07 DIAGNOSIS — E782 Mixed hyperlipidemia: Secondary | ICD-10-CM | POA: Diagnosis not present

## 2020-10-07 DIAGNOSIS — Z23 Encounter for immunization: Secondary | ICD-10-CM | POA: Diagnosis not present

## 2020-10-07 DIAGNOSIS — Z9189 Other specified personal risk factors, not elsewhere classified: Secondary | ICD-10-CM | POA: Diagnosis not present

## 2020-10-07 DIAGNOSIS — E1165 Type 2 diabetes mellitus with hyperglycemia: Secondary | ICD-10-CM | POA: Diagnosis not present

## 2020-10-07 DIAGNOSIS — Z0001 Encounter for general adult medical examination with abnormal findings: Secondary | ICD-10-CM | POA: Diagnosis not present

## 2020-11-12 DIAGNOSIS — R7989 Other specified abnormal findings of blood chemistry: Secondary | ICD-10-CM | POA: Diagnosis not present

## 2020-11-12 DIAGNOSIS — R0602 Shortness of breath: Secondary | ICD-10-CM | POA: Diagnosis not present

## 2020-11-25 DIAGNOSIS — E7849 Other hyperlipidemia: Secondary | ICD-10-CM | POA: Diagnosis not present

## 2020-11-25 DIAGNOSIS — I10 Essential (primary) hypertension: Secondary | ICD-10-CM | POA: Diagnosis not present

## 2020-11-25 DIAGNOSIS — I700 Atherosclerosis of aorta: Secondary | ICD-10-CM | POA: Diagnosis not present

## 2020-11-25 DIAGNOSIS — E1165 Type 2 diabetes mellitus with hyperglycemia: Secondary | ICD-10-CM | POA: Diagnosis not present

## 2020-11-25 DIAGNOSIS — J301 Allergic rhinitis due to pollen: Secondary | ICD-10-CM | POA: Diagnosis not present

## 2020-11-25 DIAGNOSIS — Z6841 Body Mass Index (BMI) 40.0 and over, adult: Secondary | ICD-10-CM | POA: Diagnosis not present

## 2020-12-14 DIAGNOSIS — R011 Cardiac murmur, unspecified: Secondary | ICD-10-CM | POA: Diagnosis not present

## 2021-02-07 DIAGNOSIS — Z780 Asymptomatic menopausal state: Secondary | ICD-10-CM | POA: Diagnosis not present

## 2021-02-07 DIAGNOSIS — C50011 Malignant neoplasm of nipple and areola, right female breast: Secondary | ICD-10-CM | POA: Diagnosis not present

## 2021-02-07 DIAGNOSIS — M8580 Other specified disorders of bone density and structure, unspecified site: Secondary | ICD-10-CM | POA: Diagnosis not present

## 2021-02-07 DIAGNOSIS — C50911 Malignant neoplasm of unspecified site of right female breast: Secondary | ICD-10-CM | POA: Diagnosis not present

## 2021-02-24 DIAGNOSIS — C50211 Malignant neoplasm of upper-inner quadrant of right female breast: Secondary | ICD-10-CM | POA: Diagnosis not present

## 2021-02-24 DIAGNOSIS — Z170 Estrogen receptor positive status [ER+]: Secondary | ICD-10-CM | POA: Diagnosis not present

## 2021-02-24 DIAGNOSIS — E871 Hypo-osmolality and hyponatremia: Secondary | ICD-10-CM | POA: Diagnosis not present

## 2021-02-24 DIAGNOSIS — C50912 Malignant neoplasm of unspecified site of left female breast: Secondary | ICD-10-CM | POA: Diagnosis not present

## 2021-02-24 DIAGNOSIS — I10 Essential (primary) hypertension: Secondary | ICD-10-CM | POA: Diagnosis not present

## 2021-02-24 DIAGNOSIS — Z7984 Long term (current) use of oral hypoglycemic drugs: Secondary | ICD-10-CM | POA: Diagnosis not present

## 2021-02-24 DIAGNOSIS — Z780 Asymptomatic menopausal state: Secondary | ICD-10-CM | POA: Diagnosis not present

## 2021-02-24 DIAGNOSIS — Z880 Allergy status to penicillin: Secondary | ICD-10-CM | POA: Diagnosis not present

## 2021-02-24 DIAGNOSIS — M8580 Other specified disorders of bone density and structure, unspecified site: Secondary | ICD-10-CM | POA: Diagnosis not present

## 2021-02-24 DIAGNOSIS — C50911 Malignant neoplasm of unspecified site of right female breast: Secondary | ICD-10-CM | POA: Diagnosis not present

## 2021-02-24 DIAGNOSIS — Z9012 Acquired absence of left breast and nipple: Secondary | ICD-10-CM | POA: Diagnosis not present

## 2021-02-24 DIAGNOSIS — Z886 Allergy status to analgesic agent status: Secondary | ICD-10-CM | POA: Diagnosis not present

## 2021-02-24 DIAGNOSIS — Z791 Long term (current) use of non-steroidal anti-inflammatories (NSAID): Secondary | ICD-10-CM | POA: Diagnosis not present

## 2021-02-24 DIAGNOSIS — Z1231 Encounter for screening mammogram for malignant neoplasm of breast: Secondary | ICD-10-CM | POA: Diagnosis not present

## 2021-02-24 DIAGNOSIS — E119 Type 2 diabetes mellitus without complications: Secondary | ICD-10-CM | POA: Diagnosis not present

## 2021-02-24 DIAGNOSIS — E559 Vitamin D deficiency, unspecified: Secondary | ICD-10-CM | POA: Diagnosis not present

## 2021-02-24 DIAGNOSIS — Z79899 Other long term (current) drug therapy: Secondary | ICD-10-CM | POA: Diagnosis not present

## 2021-02-24 DIAGNOSIS — Z8049 Family history of malignant neoplasm of other genital organs: Secondary | ICD-10-CM | POA: Diagnosis not present

## 2021-02-24 DIAGNOSIS — Z8711 Personal history of peptic ulcer disease: Secondary | ICD-10-CM | POA: Diagnosis not present

## 2021-02-24 DIAGNOSIS — Z808 Family history of malignant neoplasm of other organs or systems: Secondary | ICD-10-CM | POA: Diagnosis not present

## 2021-02-24 DIAGNOSIS — M1710 Unilateral primary osteoarthritis, unspecified knee: Secondary | ICD-10-CM | POA: Diagnosis not present

## 2021-02-24 DIAGNOSIS — Z885 Allergy status to narcotic agent status: Secondary | ICD-10-CM | POA: Diagnosis not present

## 2021-02-24 DIAGNOSIS — Z79811 Long term (current) use of aromatase inhibitors: Secondary | ICD-10-CM | POA: Diagnosis not present

## 2021-04-01 ENCOUNTER — Other Ambulatory Visit: Payer: Self-pay | Admitting: Oncology

## 2021-04-01 DIAGNOSIS — Z1231 Encounter for screening mammogram for malignant neoplasm of breast: Secondary | ICD-10-CM

## 2021-04-04 DIAGNOSIS — E7849 Other hyperlipidemia: Secondary | ICD-10-CM | POA: Diagnosis not present

## 2021-04-04 DIAGNOSIS — E782 Mixed hyperlipidemia: Secondary | ICD-10-CM | POA: Diagnosis not present

## 2021-04-04 DIAGNOSIS — Z1329 Encounter for screening for other suspected endocrine disorder: Secondary | ICD-10-CM | POA: Diagnosis not present

## 2021-04-04 DIAGNOSIS — I10 Essential (primary) hypertension: Secondary | ICD-10-CM | POA: Diagnosis not present

## 2021-04-04 DIAGNOSIS — K2100 Gastro-esophageal reflux disease with esophagitis, without bleeding: Secondary | ICD-10-CM | POA: Diagnosis not present

## 2021-04-04 DIAGNOSIS — E1165 Type 2 diabetes mellitus with hyperglycemia: Secondary | ICD-10-CM | POA: Diagnosis not present

## 2021-04-07 DIAGNOSIS — Z0001 Encounter for general adult medical examination with abnormal findings: Secondary | ICD-10-CM | POA: Diagnosis not present

## 2021-04-07 DIAGNOSIS — I700 Atherosclerosis of aorta: Secondary | ICD-10-CM | POA: Diagnosis not present

## 2021-04-07 DIAGNOSIS — E7849 Other hyperlipidemia: Secondary | ICD-10-CM | POA: Diagnosis not present

## 2021-04-07 DIAGNOSIS — C50919 Malignant neoplasm of unspecified site of unspecified female breast: Secondary | ICD-10-CM | POA: Diagnosis not present

## 2021-04-07 DIAGNOSIS — Z1331 Encounter for screening for depression: Secondary | ICD-10-CM | POA: Diagnosis not present

## 2021-04-07 DIAGNOSIS — I10 Essential (primary) hypertension: Secondary | ICD-10-CM | POA: Diagnosis not present

## 2021-04-07 DIAGNOSIS — E1165 Type 2 diabetes mellitus with hyperglycemia: Secondary | ICD-10-CM | POA: Diagnosis not present

## 2021-04-07 DIAGNOSIS — Z1389 Encounter for screening for other disorder: Secondary | ICD-10-CM | POA: Diagnosis not present

## 2021-05-04 DIAGNOSIS — Z23 Encounter for immunization: Secondary | ICD-10-CM | POA: Diagnosis not present

## 2021-07-19 DIAGNOSIS — H52203 Unspecified astigmatism, bilateral: Secondary | ICD-10-CM | POA: Diagnosis not present

## 2021-07-19 DIAGNOSIS — Z961 Presence of intraocular lens: Secondary | ICD-10-CM | POA: Diagnosis not present

## 2021-07-19 DIAGNOSIS — H401131 Primary open-angle glaucoma, bilateral, mild stage: Secondary | ICD-10-CM | POA: Diagnosis not present

## 2021-08-01 ENCOUNTER — Other Ambulatory Visit: Payer: Self-pay

## 2021-08-01 ENCOUNTER — Ambulatory Visit
Admission: RE | Admit: 2021-08-01 | Discharge: 2021-08-01 | Disposition: A | Discharge status: Home / Self Care | Payer: Medicare Other | Source: Ambulatory Visit | Attending: Oncology | Admitting: Oncology

## 2021-08-01 DIAGNOSIS — Z1231 Encounter for screening mammogram for malignant neoplasm of breast: Secondary | ICD-10-CM | POA: Diagnosis not present

## 2021-08-31 DIAGNOSIS — E559 Vitamin D deficiency, unspecified: Secondary | ICD-10-CM | POA: Diagnosis not present

## 2021-08-31 DIAGNOSIS — C50011 Malignant neoplasm of nipple and areola, right female breast: Secondary | ICD-10-CM | POA: Diagnosis not present

## 2021-08-31 DIAGNOSIS — C50911 Malignant neoplasm of unspecified site of right female breast: Secondary | ICD-10-CM | POA: Diagnosis not present

## 2021-09-07 DIAGNOSIS — C50911 Malignant neoplasm of unspecified site of right female breast: Secondary | ICD-10-CM | POA: Diagnosis not present

## 2021-09-12 DIAGNOSIS — H401131 Primary open-angle glaucoma, bilateral, mild stage: Secondary | ICD-10-CM | POA: Diagnosis not present

## 2021-12-13 DIAGNOSIS — H401131 Primary open-angle glaucoma, bilateral, mild stage: Secondary | ICD-10-CM | POA: Diagnosis not present

## 2021-12-14 DIAGNOSIS — I10 Essential (primary) hypertension: Secondary | ICD-10-CM | POA: Diagnosis not present

## 2021-12-14 DIAGNOSIS — E1165 Type 2 diabetes mellitus with hyperglycemia: Secondary | ICD-10-CM | POA: Diagnosis not present

## 2021-12-14 DIAGNOSIS — R5383 Other fatigue: Secondary | ICD-10-CM | POA: Diagnosis not present

## 2021-12-14 DIAGNOSIS — E782 Mixed hyperlipidemia: Secondary | ICD-10-CM | POA: Diagnosis not present

## 2021-12-14 DIAGNOSIS — K219 Gastro-esophageal reflux disease without esophagitis: Secondary | ICD-10-CM | POA: Diagnosis not present

## 2021-12-14 DIAGNOSIS — E7849 Other hyperlipidemia: Secondary | ICD-10-CM | POA: Diagnosis not present

## 2021-12-21 DIAGNOSIS — J301 Allergic rhinitis due to pollen: Secondary | ICD-10-CM | POA: Diagnosis not present

## 2021-12-21 DIAGNOSIS — E7849 Other hyperlipidemia: Secondary | ICD-10-CM | POA: Diagnosis not present

## 2021-12-21 DIAGNOSIS — Z23 Encounter for immunization: Secondary | ICD-10-CM | POA: Diagnosis not present

## 2021-12-21 DIAGNOSIS — Z6841 Body Mass Index (BMI) 40.0 and over, adult: Secondary | ICD-10-CM | POA: Diagnosis not present

## 2021-12-21 DIAGNOSIS — C50919 Malignant neoplasm of unspecified site of unspecified female breast: Secondary | ICD-10-CM | POA: Diagnosis not present

## 2021-12-21 DIAGNOSIS — I10 Essential (primary) hypertension: Secondary | ICD-10-CM | POA: Diagnosis not present

## 2021-12-21 DIAGNOSIS — I700 Atherosclerosis of aorta: Secondary | ICD-10-CM | POA: Diagnosis not present

## 2021-12-21 DIAGNOSIS — Z9189 Other specified personal risk factors, not elsewhere classified: Secondary | ICD-10-CM | POA: Diagnosis not present

## 2021-12-21 DIAGNOSIS — E1165 Type 2 diabetes mellitus with hyperglycemia: Secondary | ICD-10-CM | POA: Diagnosis not present

## 2021-12-21 DIAGNOSIS — E782 Mixed hyperlipidemia: Secondary | ICD-10-CM | POA: Diagnosis not present

## 2022-03-03 DIAGNOSIS — C50911 Malignant neoplasm of unspecified site of right female breast: Secondary | ICD-10-CM | POA: Diagnosis not present

## 2022-03-07 DIAGNOSIS — E559 Vitamin D deficiency, unspecified: Secondary | ICD-10-CM | POA: Diagnosis not present

## 2022-03-07 DIAGNOSIS — Z853 Personal history of malignant neoplasm of breast: Secondary | ICD-10-CM | POA: Diagnosis not present

## 2022-03-07 DIAGNOSIS — C50911 Malignant neoplasm of unspecified site of right female breast: Secondary | ICD-10-CM | POA: Diagnosis not present

## 2022-04-13 DIAGNOSIS — H401131 Primary open-angle glaucoma, bilateral, mild stage: Secondary | ICD-10-CM | POA: Diagnosis not present

## 2022-04-17 DIAGNOSIS — E1165 Type 2 diabetes mellitus with hyperglycemia: Secondary | ICD-10-CM | POA: Diagnosis not present

## 2022-04-17 DIAGNOSIS — E871 Hypo-osmolality and hyponatremia: Secondary | ICD-10-CM | POA: Diagnosis not present

## 2022-04-17 DIAGNOSIS — I10 Essential (primary) hypertension: Secondary | ICD-10-CM | POA: Diagnosis not present

## 2022-04-17 DIAGNOSIS — E7849 Other hyperlipidemia: Secondary | ICD-10-CM | POA: Diagnosis not present

## 2022-04-17 DIAGNOSIS — E782 Mixed hyperlipidemia: Secondary | ICD-10-CM | POA: Diagnosis not present

## 2022-04-17 DIAGNOSIS — R7301 Impaired fasting glucose: Secondary | ICD-10-CM | POA: Diagnosis not present

## 2022-04-26 DIAGNOSIS — C50919 Malignant neoplasm of unspecified site of unspecified female breast: Secondary | ICD-10-CM | POA: Diagnosis not present

## 2022-04-26 DIAGNOSIS — I10 Essential (primary) hypertension: Secondary | ICD-10-CM | POA: Diagnosis not present

## 2022-04-26 DIAGNOSIS — J301 Allergic rhinitis due to pollen: Secondary | ICD-10-CM | POA: Diagnosis not present

## 2022-04-26 DIAGNOSIS — I700 Atherosclerosis of aorta: Secondary | ICD-10-CM | POA: Diagnosis not present

## 2022-04-26 DIAGNOSIS — Z6841 Body Mass Index (BMI) 40.0 and over, adult: Secondary | ICD-10-CM | POA: Diagnosis not present

## 2022-04-26 DIAGNOSIS — E1165 Type 2 diabetes mellitus with hyperglycemia: Secondary | ICD-10-CM | POA: Diagnosis not present

## 2022-04-26 DIAGNOSIS — E7849 Other hyperlipidemia: Secondary | ICD-10-CM | POA: Diagnosis not present

## 2022-04-26 DIAGNOSIS — Z0001 Encounter for general adult medical examination with abnormal findings: Secondary | ICD-10-CM | POA: Diagnosis not present

## 2022-07-12 ENCOUNTER — Other Ambulatory Visit: Payer: Self-pay | Admitting: Family Medicine

## 2022-07-12 DIAGNOSIS — Z1231 Encounter for screening mammogram for malignant neoplasm of breast: Secondary | ICD-10-CM

## 2022-07-28 DIAGNOSIS — Z23 Encounter for immunization: Secondary | ICD-10-CM | POA: Diagnosis not present

## 2022-08-10 ENCOUNTER — Ambulatory Visit: Payer: Medicare Other

## 2022-08-16 DIAGNOSIS — H401131 Primary open-angle glaucoma, bilateral, mild stage: Secondary | ICD-10-CM | POA: Diagnosis not present

## 2022-08-16 DIAGNOSIS — Z961 Presence of intraocular lens: Secondary | ICD-10-CM | POA: Diagnosis not present

## 2022-09-05 DIAGNOSIS — C50919 Malignant neoplasm of unspecified site of unspecified female breast: Secondary | ICD-10-CM | POA: Diagnosis not present

## 2022-09-05 DIAGNOSIS — E559 Vitamin D deficiency, unspecified: Secondary | ICD-10-CM | POA: Diagnosis not present

## 2022-09-05 DIAGNOSIS — C50011 Malignant neoplasm of nipple and areola, right female breast: Secondary | ICD-10-CM | POA: Diagnosis not present

## 2022-09-05 DIAGNOSIS — E871 Hypo-osmolality and hyponatremia: Secondary | ICD-10-CM | POA: Diagnosis not present

## 2022-09-05 DIAGNOSIS — C50911 Malignant neoplasm of unspecified site of right female breast: Secondary | ICD-10-CM | POA: Diagnosis not present

## 2022-09-05 DIAGNOSIS — M8580 Other specified disorders of bone density and structure, unspecified site: Secondary | ICD-10-CM | POA: Diagnosis not present

## 2022-09-05 DIAGNOSIS — Z780 Asymptomatic menopausal state: Secondary | ICD-10-CM | POA: Diagnosis not present

## 2022-09-05 DIAGNOSIS — Z853 Personal history of malignant neoplasm of breast: Secondary | ICD-10-CM | POA: Diagnosis not present

## 2022-10-04 ENCOUNTER — Ambulatory Visit
Admission: RE | Admit: 2022-10-04 | Discharge: 2022-10-04 | Disposition: A | Discharge status: Home / Self Care | Payer: Medicare Other | Source: Ambulatory Visit | Attending: Family Medicine | Admitting: Family Medicine

## 2022-10-04 DIAGNOSIS — Z1231 Encounter for screening mammogram for malignant neoplasm of breast: Secondary | ICD-10-CM | POA: Diagnosis not present

## 2022-10-12 DIAGNOSIS — E559 Vitamin D deficiency, unspecified: Secondary | ICD-10-CM | POA: Diagnosis not present

## 2022-10-12 DIAGNOSIS — Z853 Personal history of malignant neoplasm of breast: Secondary | ICD-10-CM | POA: Diagnosis not present

## 2022-10-12 DIAGNOSIS — C50911 Malignant neoplasm of unspecified site of right female breast: Secondary | ICD-10-CM | POA: Diagnosis not present

## 2022-10-17 DIAGNOSIS — E7849 Other hyperlipidemia: Secondary | ICD-10-CM | POA: Diagnosis not present

## 2022-10-17 DIAGNOSIS — E1122 Type 2 diabetes mellitus with diabetic chronic kidney disease: Secondary | ICD-10-CM | POA: Diagnosis not present

## 2022-10-17 DIAGNOSIS — I10 Essential (primary) hypertension: Secondary | ICD-10-CM | POA: Diagnosis not present

## 2022-10-17 DIAGNOSIS — E1165 Type 2 diabetes mellitus with hyperglycemia: Secondary | ICD-10-CM | POA: Diagnosis not present

## 2022-10-17 DIAGNOSIS — E871 Hypo-osmolality and hyponatremia: Secondary | ICD-10-CM | POA: Diagnosis not present

## 2022-10-23 DIAGNOSIS — I700 Atherosclerosis of aorta: Secondary | ICD-10-CM | POA: Diagnosis not present

## 2022-10-23 DIAGNOSIS — I10 Essential (primary) hypertension: Secondary | ICD-10-CM | POA: Diagnosis not present

## 2022-10-23 DIAGNOSIS — J301 Allergic rhinitis due to pollen: Secondary | ICD-10-CM | POA: Diagnosis not present

## 2022-10-23 DIAGNOSIS — E1165 Type 2 diabetes mellitus with hyperglycemia: Secondary | ICD-10-CM | POA: Diagnosis not present

## 2022-10-23 DIAGNOSIS — Z6841 Body Mass Index (BMI) 40.0 and over, adult: Secondary | ICD-10-CM | POA: Diagnosis not present

## 2022-10-23 DIAGNOSIS — E7849 Other hyperlipidemia: Secondary | ICD-10-CM | POA: Diagnosis not present

## 2022-12-18 DIAGNOSIS — H401131 Primary open-angle glaucoma, bilateral, mild stage: Secondary | ICD-10-CM | POA: Diagnosis not present

## 2023-04-04 DIAGNOSIS — Z0001 Encounter for general adult medical examination with abnormal findings: Secondary | ICD-10-CM | POA: Diagnosis not present

## 2023-04-04 DIAGNOSIS — E7849 Other hyperlipidemia: Secondary | ICD-10-CM | POA: Diagnosis not present

## 2023-04-04 DIAGNOSIS — E871 Hypo-osmolality and hyponatremia: Secondary | ICD-10-CM | POA: Diagnosis not present

## 2023-04-04 DIAGNOSIS — E1122 Type 2 diabetes mellitus with diabetic chronic kidney disease: Secondary | ICD-10-CM | POA: Diagnosis not present

## 2023-04-04 DIAGNOSIS — E039 Hypothyroidism, unspecified: Secondary | ICD-10-CM | POA: Diagnosis not present

## 2023-04-11 DIAGNOSIS — Z9189 Other specified personal risk factors, not elsewhere classified: Secondary | ICD-10-CM | POA: Diagnosis not present

## 2023-04-11 DIAGNOSIS — E7849 Other hyperlipidemia: Secondary | ICD-10-CM | POA: Diagnosis not present

## 2023-04-11 DIAGNOSIS — Z6841 Body Mass Index (BMI) 40.0 and over, adult: Secondary | ICD-10-CM | POA: Diagnosis not present

## 2023-04-11 DIAGNOSIS — Z23 Encounter for immunization: Secondary | ICD-10-CM | POA: Diagnosis not present

## 2023-04-11 DIAGNOSIS — E1165 Type 2 diabetes mellitus with hyperglycemia: Secondary | ICD-10-CM | POA: Diagnosis not present

## 2023-04-11 DIAGNOSIS — I10 Essential (primary) hypertension: Secondary | ICD-10-CM | POA: Diagnosis not present

## 2023-04-11 DIAGNOSIS — I700 Atherosclerosis of aorta: Secondary | ICD-10-CM | POA: Diagnosis not present

## 2023-04-11 DIAGNOSIS — E782 Mixed hyperlipidemia: Secondary | ICD-10-CM | POA: Diagnosis not present

## 2023-04-11 DIAGNOSIS — J301 Allergic rhinitis due to pollen: Secondary | ICD-10-CM | POA: Diagnosis not present

## 2023-06-25 DIAGNOSIS — Z6841 Body Mass Index (BMI) 40.0 and over, adult: Secondary | ICD-10-CM | POA: Diagnosis not present

## 2023-06-25 DIAGNOSIS — I10 Essential (primary) hypertension: Secondary | ICD-10-CM | POA: Diagnosis not present

## 2023-06-25 DIAGNOSIS — Z23 Encounter for immunization: Secondary | ICD-10-CM | POA: Diagnosis not present

## 2023-07-02 DIAGNOSIS — H524 Presbyopia: Secondary | ICD-10-CM | POA: Diagnosis not present

## 2023-07-02 DIAGNOSIS — E119 Type 2 diabetes mellitus without complications: Secondary | ICD-10-CM | POA: Diagnosis not present

## 2023-07-02 DIAGNOSIS — H401131 Primary open-angle glaucoma, bilateral, mild stage: Secondary | ICD-10-CM | POA: Diagnosis not present

## 2023-07-02 DIAGNOSIS — Z7984 Long term (current) use of oral hypoglycemic drugs: Secondary | ICD-10-CM | POA: Diagnosis not present

## 2023-07-02 DIAGNOSIS — Z961 Presence of intraocular lens: Secondary | ICD-10-CM | POA: Diagnosis not present

## 2023-07-23 DIAGNOSIS — I10 Essential (primary) hypertension: Secondary | ICD-10-CM | POA: Diagnosis not present

## 2023-09-11 DIAGNOSIS — S40011A Contusion of right shoulder, initial encounter: Secondary | ICD-10-CM | POA: Diagnosis not present

## 2023-09-11 DIAGNOSIS — S7002XA Contusion of left hip, initial encounter: Secondary | ICD-10-CM | POA: Diagnosis not present

## 2023-09-11 DIAGNOSIS — R030 Elevated blood-pressure reading, without diagnosis of hypertension: Secondary | ICD-10-CM | POA: Diagnosis not present

## 2023-10-04 DIAGNOSIS — H401131 Primary open-angle glaucoma, bilateral, mild stage: Secondary | ICD-10-CM | POA: Diagnosis not present

## 2023-10-06 DIAGNOSIS — R030 Elevated blood-pressure reading, without diagnosis of hypertension: Secondary | ICD-10-CM | POA: Diagnosis not present

## 2023-10-06 DIAGNOSIS — Z6841 Body Mass Index (BMI) 40.0 and over, adult: Secondary | ICD-10-CM | POA: Diagnosis not present

## 2023-10-06 DIAGNOSIS — M5432 Sciatica, left side: Secondary | ICD-10-CM | POA: Diagnosis not present

## 2023-10-06 DIAGNOSIS — S40011D Contusion of right shoulder, subsequent encounter: Secondary | ICD-10-CM | POA: Diagnosis not present

## 2023-10-06 DIAGNOSIS — S7002XD Contusion of left hip, subsequent encounter: Secondary | ICD-10-CM | POA: Diagnosis not present

## 2023-10-25 DIAGNOSIS — Z1321 Encounter for screening for nutritional disorder: Secondary | ICD-10-CM | POA: Diagnosis not present

## 2023-10-25 DIAGNOSIS — I10 Essential (primary) hypertension: Secondary | ICD-10-CM | POA: Diagnosis not present

## 2023-10-25 DIAGNOSIS — E559 Vitamin D deficiency, unspecified: Secondary | ICD-10-CM | POA: Diagnosis not present

## 2023-10-25 DIAGNOSIS — K219 Gastro-esophageal reflux disease without esophagitis: Secondary | ICD-10-CM | POA: Diagnosis not present

## 2023-10-25 DIAGNOSIS — E7849 Other hyperlipidemia: Secondary | ICD-10-CM | POA: Diagnosis not present

## 2023-10-25 DIAGNOSIS — E782 Mixed hyperlipidemia: Secondary | ICD-10-CM | POA: Diagnosis not present

## 2023-10-25 DIAGNOSIS — E1122 Type 2 diabetes mellitus with diabetic chronic kidney disease: Secondary | ICD-10-CM | POA: Diagnosis not present

## 2023-10-30 DIAGNOSIS — E1165 Type 2 diabetes mellitus with hyperglycemia: Secondary | ICD-10-CM | POA: Diagnosis not present

## 2023-10-30 DIAGNOSIS — M1712 Unilateral primary osteoarthritis, left knee: Secondary | ICD-10-CM | POA: Diagnosis not present

## 2023-10-30 DIAGNOSIS — I10 Essential (primary) hypertension: Secondary | ICD-10-CM | POA: Diagnosis not present

## 2023-10-30 DIAGNOSIS — R030 Elevated blood-pressure reading, without diagnosis of hypertension: Secondary | ICD-10-CM | POA: Diagnosis not present

## 2023-10-30 DIAGNOSIS — Z6839 Body mass index (BMI) 39.0-39.9, adult: Secondary | ICD-10-CM | POA: Diagnosis not present

## 2023-12-26 DIAGNOSIS — Z961 Presence of intraocular lens: Secondary | ICD-10-CM | POA: Diagnosis not present

## 2023-12-26 DIAGNOSIS — H40003 Preglaucoma, unspecified, bilateral: Secondary | ICD-10-CM | POA: Diagnosis not present

## 2024-02-01 DIAGNOSIS — S63634A Sprain of interphalangeal joint of right ring finger, initial encounter: Secondary | ICD-10-CM | POA: Diagnosis not present

## 2024-02-01 DIAGNOSIS — S63614A Unspecified sprain of right ring finger, initial encounter: Secondary | ICD-10-CM | POA: Diagnosis not present

## 2024-03-22 DIAGNOSIS — I10 Essential (primary) hypertension: Secondary | ICD-10-CM | POA: Diagnosis not present

## 2024-03-22 DIAGNOSIS — M1712 Unilateral primary osteoarthritis, left knee: Secondary | ICD-10-CM | POA: Diagnosis not present

## 2024-03-22 DIAGNOSIS — E1165 Type 2 diabetes mellitus with hyperglycemia: Secondary | ICD-10-CM | POA: Diagnosis not present

## 2024-03-22 DIAGNOSIS — E1122 Type 2 diabetes mellitus with diabetic chronic kidney disease: Secondary | ICD-10-CM | POA: Diagnosis not present

## 2024-03-22 DIAGNOSIS — Z6837 Body mass index (BMI) 37.0-37.9, adult: Secondary | ICD-10-CM | POA: Diagnosis not present

## 2024-03-28 DIAGNOSIS — Z1331 Encounter for screening for depression: Secondary | ICD-10-CM | POA: Diagnosis not present

## 2024-03-28 DIAGNOSIS — Z6837 Body mass index (BMI) 37.0-37.9, adult: Secondary | ICD-10-CM | POA: Diagnosis not present

## 2024-03-28 DIAGNOSIS — Z1389 Encounter for screening for other disorder: Secondary | ICD-10-CM | POA: Diagnosis not present

## 2024-03-28 DIAGNOSIS — Z0001 Encounter for general adult medical examination with abnormal findings: Secondary | ICD-10-CM | POA: Diagnosis not present
